# Patient Record
Sex: Male | Born: 1965 | Race: Black or African American | Hispanic: No | Marital: Single | Smoking: Never smoker
Health system: Southern US, Community
[De-identification: ages and names within clinical notes are randomized; demographics above are authoritative.]

## PROBLEM LIST (undated history)

## (undated) DIAGNOSIS — T7840XA Allergy, unspecified, initial encounter: Secondary | ICD-10-CM

## (undated) DIAGNOSIS — D649 Anemia, unspecified: Secondary | ICD-10-CM

## (undated) DIAGNOSIS — J45909 Unspecified asthma, uncomplicated: Secondary | ICD-10-CM

## (undated) HISTORY — DX: Anemia, unspecified: D64.9

## (undated) HISTORY — DX: Allergy, unspecified, initial encounter: T78.40XA

---

## 2018-01-21 ENCOUNTER — Ambulatory Visit (HOSPITAL_COMMUNITY)
Admission: EM | Admit: 2018-01-21 | Discharge: 2018-01-21 | Disposition: A | Discharge status: Home / Self Care | Payer: Commercial Managed Care - PPO | Attending: Family Medicine | Admitting: Family Medicine

## 2018-01-21 ENCOUNTER — Encounter (HOSPITAL_COMMUNITY): Payer: Self-pay | Admitting: Emergency Medicine

## 2018-01-21 DIAGNOSIS — J4520 Mild intermittent asthma, uncomplicated: Secondary | ICD-10-CM

## 2018-01-21 HISTORY — DX: Unspecified asthma, uncomplicated: J45.909

## 2018-01-21 MED ORDER — MONTELUKAST SODIUM 10 MG PO TABS: 10.0000 mg | ORAL_TABLET | Freq: Every day | ORAL | 1 refills | Status: DC

## 2018-01-21 MED ORDER — ALBUTEROL SULFATE HFA 108 (90 BASE) MCG/ACT IN AERS: 1.0000 | INHALATION_SPRAY | Freq: Four times a day (QID) | RESPIRATORY_TRACT | 1 refills | Status: DC | PRN

## 2018-01-21 NOTE — ED Triage Notes (Signed)
PT requests refill for albuterol inhaler and a recommendation for a PCP.

## 2018-01-21 NOTE — ED Provider Notes (Signed)
Eddie Glass    CSN: [id number] Arrival date & time: 01/21/18  1837     History   Chief Complaint Chief Complaint  Patient presents with  . Medication Refill    HPI Eddie Glass is a 52 y.o. male.   HPI  Patient has intermittent asthma.  He usually uses albuterol infrequently.  For the last couple months he is needed it more frequently.  He has been using it almost daily.  He would like the name of a primary care doctor close to his home.  He also would like a refill of his albuterol.  He is in good health otherwise and on no medication.  He use the albuterol today and currently has no wheezing.  He has had no history of cigarette smoking, COPD, or other lung disease.  He needs updated immunizations and pneumonia shots.   Past Medical History:  Diagnosis Date  . Asthma     There are no active problems to display for this patient.   History reviewed. No pertinent surgical history.     Home Medications    Prior to Admission medications   Medication Sig Start Date End Date Taking? Authorizing Provider  albuterol (PROVENTIL HFA;VENTOLIN HFA) 108 (90 Base) MCG/ACT inhaler Inhale into the lungs every 6 (six) hours as needed for wheezing or shortness of breath.   Yes [provider]  albuterol (PROAIR HFA) 108 (90 Base) MCG/ACT inhaler Inhale 1 puff into the lungs every 6 (six) hours as needed for wheezing or shortness of breath. 01/21/18   Raylene Everts, MD  montelukast (SINGULAIR) 10 MG tablet Take 1 tablet (10 mg total) by mouth at bedtime. 01/21/18   Raylene Everts, MD    Family History No family history on file. No family history of asthma or allergies Social History Social History   Tobacco Use  . Smoking status: Not on file  Substance Use Topics  . Alcohol use: Not on file  . Drug use: Not on file     Allergies   Patient has no known allergies.   Review of Systems Review of Systems  Constitutional: Negative for chills and fever.   HENT: Negative for ear pain and sore throat.   Eyes: Negative for pain and visual disturbance.  Respiratory: Positive for wheezing. Negative for cough and shortness of breath.   Cardiovascular: Negative for chest pain and palpitations.  Gastrointestinal: Negative for abdominal pain and vomiting.  Genitourinary: Negative for dysuria and hematuria.  Musculoskeletal: Negative for arthralgias and back pain.  Skin: Negative for color change and rash.  Neurological: Negative for seizures and syncope.  All other systems reviewed and are negative.    Physical Exam Triage Vital Signs ED Triage Vitals  Enc Vitals Group     BP 01/21/18 1904 139/90     Pulse Rate 01/21/18 1904 80     Resp 01/21/18 1904 16     Temp 01/21/18 1904 98.2 F (36.8 C)     Temp Source 01/21/18 1904 Oral     SpO2 01/21/18 1904 99 %     Weight 01/21/18 1906 260 lb (117.9 kg)     Height --      Head Circumference --      Peak Flow --      Pain Score 01/21/18 1906 0     Pain Loc --      Pain Edu? --      Excl. in [place]? --    No data found.  Updated Vital Signs BP 139/90 (BP Location: Right Arm)   Pulse 80   Temp 98.2 F (36.8 C) (Oral)   Resp 16   Wt 260 lb (117.9 kg)   SpO2 99%      Physical Exam  Constitutional: He appears well-developed and well-nourished. No distress.  HENT:  Head: Normocephalic and atraumatic.  Mouth/Throat: Oropharynx is clear and moist.  Eyes: Pupils are equal, round, and reactive to light. Conjunctivae are normal.  Neck: Normal range of motion.  Cardiovascular: Normal rate, regular rhythm and normal heart sounds.  Pulmonary/Chest: Effort normal and breath sounds normal. No respiratory distress. He has no wheezes.  Abdominal: Soft. He exhibits no distension.  Musculoskeletal: Normal range of motion. He exhibits no edema.  Neurological: He is alert.  Skin: Skin is warm and dry.  Psychiatric: He has a normal mood and affect. His behavior is normal.     UC Treatments /  Results  Labs (all labs ordered are listed, but only abnormal results are displayed) Labs Reviewed - No data to display  EKG None  Radiology No results found.  Procedures Procedures (including critical care time)  Medications Ordered in UC Medications - No data to display  Initial Impression / Assessment and Plan / UC Course  I have reviewed the triage vital signs and the nursing notes.  Pertinent labs & imaging results that were available during my care of the patient were reviewed by me and considered in my medical decision making (see chart for details).    Discussed with the patient that I am going to add Singulair to see if this reduces his need for the albuterol.  I gave him the name of a family practice close to his home that is accepting new patients.  Final Clinical Impressions(s) / UC Diagnoses   Final diagnoses:  Mild intermittent asthma without complication     Discharge Instructions     Take the singulair once a day See if this helps you to reduce the albuterol inhaler Call [facility]   ED Prescriptions    Medication Sig Dispense Auth. Provider   montelukast (SINGULAIR) 10 MG tablet Take 1 tablet (10 mg total) by mouth at bedtime. 30 tablet Raylene Everts, MD   albuterol [facility] HFA) 108 (90 Base) MCG/ACT inhaler Inhale 1 puff into the lungs every 6 (six) hours as needed for wheezing or shortness of breath. 1 Inhaler Raylene Everts, MD     Controlled Substance Prescriptions [place] Controlled Substance Registry consulted? Not Applicable   Raylene Everts, MD 01/21/18 1929

## 2018-01-21 NOTE — Discharge Instructions (Signed)
Take the singulair once a day See if this helps you to reduce the albuterol inhaler Call [facility]

## 2018-01-30 ENCOUNTER — Encounter: Payer: Self-pay | Admitting: *Deleted

## 2018-02-11 ENCOUNTER — Other Ambulatory Visit: Payer: Self-pay

## 2018-02-11 ENCOUNTER — Encounter: Payer: Self-pay | Admitting: Family Medicine

## 2018-02-11 ENCOUNTER — Ambulatory Visit (INDEPENDENT_AMBULATORY_CARE_PROVIDER_SITE_OTHER): Payer: Commercial Managed Care - PPO | Admitting: Family Medicine

## 2018-02-11 VITALS — BP 120/70 | HR 91 | Temp 98.0°F | Resp 16 | Ht 73.0 in | Wt 272.5 lb

## 2018-02-11 DIAGNOSIS — Z0000 Encounter for general adult medical examination without abnormal findings: Secondary | ICD-10-CM | POA: Diagnosis not present

## 2018-02-11 DIAGNOSIS — J45909 Unspecified asthma, uncomplicated: Secondary | ICD-10-CM | POA: Insufficient documentation

## 2018-02-11 DIAGNOSIS — E669 Obesity, unspecified: Secondary | ICD-10-CM | POA: Diagnosis not present

## 2018-02-11 DIAGNOSIS — Z1211 Encounter for screening for malignant neoplasm of colon: Secondary | ICD-10-CM

## 2018-02-11 DIAGNOSIS — J4520 Mild intermittent asthma, uncomplicated: Secondary | ICD-10-CM

## 2018-02-11 DIAGNOSIS — T7840XA Allergy, unspecified, initial encounter: Secondary | ICD-10-CM | POA: Insufficient documentation

## 2018-02-11 DIAGNOSIS — Z6835 Body mass index (BMI) 35.0-35.9, adult: Secondary | ICD-10-CM

## 2018-02-11 MED ORDER — ZOSTER VAC RECOMB ADJUVANTED 50 MCG/0.5ML IM SUSR: 0.5000 mL | Freq: Once | INTRAMUSCULAR | 0 refills | Status: AC

## 2018-02-11 MED ORDER — BECLOMETHASONE DIPROP HFA 40 MCG/ACT IN AERB: 1.0000 | INHALATION_SPRAY | Freq: Two times a day (BID) | RESPIRATORY_TRACT | 1 refills | Status: DC

## 2018-02-11 NOTE — Patient Instructions (Addendum)
Come back any morning after 8 am for fasting labs.  We will call you with results of labs in 2-3 days.  If there is anything to discuss we will address it at the call back or schedule a follow up appointment.    Health Maintenance, Male A healthy lifestyle and preventive care is important for your health and wellness. Ask your health care provider about what schedule of regular examinations is right for you. What should I know about weight and diet? Eat a Healthy Diet  Eat plenty of vegetables, fruits, whole grains, low-fat dairy products, and lean protein.  Do not eat a lot of foods high in solid fats, added sugars, or salt.  Maintain a Healthy Weight Regular exercise can help you achieve or maintain a healthy weight. You should:  Do at least 150 minutes of exercise each week. The exercise should increase your heart rate and make you sweat (moderate-intensity exercise).  Do strength-training exercises at least twice a week.  Watch Your Levels of Cholesterol and Blood Lipids  Have your blood tested for lipids and cholesterol every 5 years starting at 52 years of age. If you are at high risk for heart disease, you should start having your blood tested when you are 52 years old. You may need to have your cholesterol levels checked more often if: ? Your lipid or cholesterol levels are high. ? You are older than 52 years of age. ? You are at high risk for heart disease.  What should I know about cancer screening? Many types of cancers can be detected early and may often be prevented. Lung Cancer  You should be screened every year for lung cancer if: ? You are a current smoker who has smoked for at least 30 years. ? You are a former smoker who has quit within the past 15 years.  Talk to your health care provider about your screening options, when you should start screening, and how often you should be screened.  Colorectal Cancer  Routine colorectal cancer screening usually  begins at 52 years of age and should be repeated every 5-10 years until you are 52 years old. You may need to be screened more often if early forms of precancerous polyps or small growths are found. Your health care provider may recommend screening at an earlier age if you have risk factors for colon cancer.  Your health care provider may recommend using home test kits to check for hidden blood in the stool.  A small camera at the end of a tube can be used to examine your colon (sigmoidoscopy or colonoscopy). This checks for the earliest forms of colorectal cancer.  Prostate and Testicular Cancer  Depending on your age and overall health, your health care provider may do certain tests to screen for prostate and testicular cancer.  Talk to your health care provider about any symptoms or concerns you have about testicular or prostate cancer.  Skin Cancer  Check your skin from head to toe regularly.  Tell your health care provider about any new moles or changes in moles, especially if: ? There is a change in a mole's size, shape, or color. ? You have a mole that is larger than a pencil eraser.  Always use sunscreen. Apply sunscreen liberally and repeat throughout the day.  Protect yourself by wearing long sleeves, pants, a wide-brimmed hat, and sunglasses when outside.  What should I know about heart disease, diabetes, and high blood pressure?  If you are 18-39  years of age, have your blood pressure checked every 3-5 years. If you are 44 years of age or older, have your blood pressure checked every year. You should have your blood pressure measured twice-once when you are at a hospital or clinic, and once when you are not at a hospital or clinic. Record the average of the two measurements. To check your blood pressure when you are not at a hospital or clinic, you can use: ? An automated blood pressure machine at a pharmacy. ? A home blood pressure monitor.  Talk to your health care  provider about your target blood pressure.  If you are between 88-35 years old, ask your health care provider if you should take aspirin to prevent heart disease.  Have regular diabetes screenings by checking your fasting blood sugar level. ? If you are at a normal weight and have a low risk for diabetes, have this test once every three years after the age of 27. ? If you are overweight and have a high risk for diabetes, consider being tested at a younger age or more often.  A one-time screening for abdominal aortic aneurysm (AAA) by ultrasound is recommended for men aged 3-75 years who are current or former smokers. What should I know about preventing infection? Hepatitis B If you have a higher risk for hepatitis B, you should be screened for this virus. Talk with your health care provider to find out if you are at risk for hepatitis B infection. Hepatitis C Blood testing is recommended for:  Everyone born from [birth year] through 1965.  Anyone with known risk factors for hepatitis C.  Sexually Transmitted Diseases (STDs)  You should be screened each year for STDs including gonorrhea and chlamydia if: ? You are sexually active and are younger than 52 years of age. ? You are older than 52 years of age and your health care provider tells you that you are at risk for this type of infection. ? Your sexual activity has changed since you were last screened and you are at an increased risk for chlamydia or gonorrhea. Ask your health care provider if you are at risk.  Talk with your health care provider about whether you are at high risk of being infected with HIV. Your health care provider may recommend a prescription medicine to help prevent HIV infection.  What else can I do?  Schedule regular health, dental, and eye exams.  Stay current with your vaccines (immunizations).  Do not use any tobacco products, such as cigarettes, chewing tobacco, and e-cigarettes. If you need help quitting, ask  your health care provider.  Limit alcohol intake to no more than 2 drinks per day. One drink equals 12 ounces of beer, 5 ounces of wine, or 1 ounces of hard liquor.  Do not use street drugs.  Do not share needles.  Ask your health care provider for help if you need support or information about quitting drugs.  Tell your health care provider if you often feel depressed.  Tell your health care provider if you have ever been abused or do not feel safe at home. This information is not intended to replace advice given to you by your health care provider. Make sure you discuss any questions you have with your health care provider. Document Released: 12/29/2007 Document Revised: 02/29/2016 Document Reviewed: 04/05/2015 Elsevier Interactive Patient Education  2018 [employer].   Asthma, Adult Asthma is a recurring condition in which the airways tighten and narrow. Asthma can make  it difficult to breathe. It can cause coughing, wheezing, and shortness of breath. Asthma episodes, also called asthma attacks, range from minor to life-threatening. Asthma cannot be cured, but medicines and lifestyle changes can help control it. What are the causes? Asthma is believed to be caused by inherited (genetic) and environmental factors, but its exact cause is unknown. Asthma may be triggered by allergens, lung infections, or irritants in the air. Asthma triggers are different for each person. Common triggers include:  Animal dander.  Dust mites.  Cockroaches.  Pollen from trees or grass.  Mold.  Smoke.  Air pollutants such as dust, household cleaners, hair sprays, aerosol sprays, paint fumes, strong chemicals, or strong odors.  Cold air, weather changes, and winds (which increase molds and pollens in the air).  Strong emotional expressions such as crying or laughing hard.  Stress.  Certain medicines (such as aspirin) or types of drugs (such as beta-blockers).  Sulfites in foods and drinks.  Foods and drinks that may contain sulfites include dried fruit, potato chips, and sparkling grape juice.  Infections or inflammatory conditions such as the flu, a cold, or an inflammation of the nasal membranes (rhinitis).  Gastroesophageal reflux disease (GERD).  Exercise or strenuous activity.  What are the signs or symptoms? Symptoms may occur immediately after asthma is triggered or many hours later. Symptoms include:  Wheezing.  Excessive nighttime or early morning coughing.  Frequent or severe coughing with a common cold.  Chest tightness.  Shortness of breath.  How is this diagnosed? The diagnosis of asthma is made by a review of your medical history and a physical exam. Tests may also be performed. These may include:  Lung function studies. These tests show how much air you breathe in and out.  Allergy tests.  Imaging tests such as X-rays.  How is this treated? Asthma cannot be cured, but it can usually be controlled. Treatment involves identifying and avoiding your asthma triggers. It also involves medicines. There are 2 classes of medicine used for asthma treatment:  Controller medicines. These prevent asthma symptoms from occurring. They are usually taken every day.  Reliever or rescue medicines. These quickly relieve asthma symptoms. They are used as needed and provide short-term relief.  Your health care provider will help you create an asthma action plan. An asthma action plan is a written plan for managing and treating your asthma attacks. It includes a list of your asthma triggers and how they may be avoided. It also includes information on when medicines should be taken and when their dosage should be changed. An action plan may also involve the use of a device called a peak flow meter. A peak flow meter measures how well the lungs are working. It helps you monitor your condition. Follow these instructions at home:  Take medicines only as directed by your  health care provider. Speak with your health care provider if you have questions about how or when to take the medicines.  Use a peak flow meter as directed by your health care provider. Record and keep track of readings.  Understand and use the action plan to help minimize or stop an asthma attack without needing to seek medical care.  Control your home environment in the following ways to help prevent asthma attacks: ? Do not smoke. Avoid being exposed to secondhand smoke. ? Change your heating and air conditioning filter regularly. ? Limit your use of fireplaces and wood stoves. ? Get rid of pests (such as roaches and  mice) and their droppings. ? Throw away plants if you see mold on them. ? Clean your floors and dust regularly. Use unscented cleaning products. ? Try to have someone else vacuum for you regularly. Stay out of rooms while they are being vacuumed and for a short while afterward. If you vacuum, use a dust mask from a hardware store, a double-layered or microfilter vacuum cleaner bag, or a vacuum cleaner with a HEPA filter. ? Replace carpet with wood, tile, or vinyl flooring. Carpet can trap dander and dust. ? Use allergy-proof pillows, mattress covers, and box spring covers. ? Wash bed sheets and blankets every week in hot water and dry them in a dryer. ? Use blankets that are made of polyester or cotton. ? Clean bathrooms and kitchens with bleach. If possible, have someone repaint the walls in these rooms with mold-resistant paint. Keep out of the rooms that are being cleaned and painted. ? Wash hands frequently. Contact a health care provider if:  You have wheezing, shortness of breath, or a cough even if taking medicine to prevent attacks.  The colored mucus you cough up (sputum) is thicker than usual.  Your sputum changes from clear or white to yellow, green, gray, or bloody.  You have any problems that may be related to the medicines you are taking (such as a rash,  itching, swelling, or trouble breathing).  You are using a reliever medicine more than 2-3 times per week.  Your peak flow is still at 50-79% of your personal best after following your action plan for 1 hour.  You have a fever. Get help right away if:  You seem to be getting worse and are unresponsive to treatment during an asthma attack.  You are short of breath even at rest.  You get short of breath when doing very little physical activity.  You have difficulty eating, drinking, or talking due to asthma symptoms.  You develop chest pain.  You develop a fast heartbeat.  You have a bluish color to your lips or fingernails.  You are light-headed, dizzy, or faint.  Your peak flow is less than 50% of your personal best. This information is not intended to replace advice given to you by your health care provider. Make sure you discuss any questions you have with your health care provider. Document Released: 07/02/2005 Document Revised: 12/14/2015 Document Reviewed: 01/29/2013 Elsevier Interactive Patient Education  2017 [employer].

## 2018-02-11 NOTE — Progress Notes (Signed)
Patient: Eddie Glass, Male    DOB: 10-01-1965, 52 y.o.   MRN: [id number] Visit Date: 02/14/2018  Today's Provider: Delsa Grana, PA-C    Chief Complaint  Patient presents with  . Establish Care   Subjective:    Annual physical exam Eddie Glass is a 51 y.o. male who presents today for health maintenance and complete physical. He feels well. He reports exercising not at all. He reports he is sleeping well.  Works at a [employer] - does physical labor all day for work Has cut out sugary drinks after weight was recently increasing.  Hx of mild intermittent asthma that has been much more severe for the past two months secondary to allergens in the house he is renting.  He usually rarely need SABA, but he is currenlty using it almost daily which in his house.  He also has been blowing a fan and opening up windows to get fresh air.  Sometimes he tries to not even be home so he does not have frequent asthma attacks.  Simple asthma hx-    02/11/18 1549  Asthma History - Simple  Symptoms Daily  Nighttime Awakenings >1/wk but not nightly  Asthma Severity    He is new to establish care at this office, has not been to the doctors in some time and has used urgent care here and there when he needs something.  His work is strenuous but otherwise he does not exercise much.  He has gained weight over the past couple years and states he just needs "to decide to loose weight and [he] can do it."     Review of Systems  Constitutional: Negative.  Negative for activity change, appetite change, fatigue and unexpected weight change.  HENT: Negative.   Eyes: Negative.   Respiratory: Negative.   Cardiovascular: Negative.  Negative for chest pain, palpitations and leg swelling.  Gastrointestinal: Negative.  Negative for abdominal pain and blood in stool.  Endocrine: Negative.   Genitourinary: Negative.  Negative for decreased urine volume, difficulty urinating, testicular pain and urgency.    Skin: Negative.  Negative for color change and pallor.  Allergic/Immunologic: Negative.   Neurological: Negative.  Negative for syncope, weakness, light-headedness and numbness.  Psychiatric/Behavioral: Negative.  Negative for confusion, dysphoric mood, self-injury and suicidal ideas. The patient is not nervous/anxious.   All other systems reviewed and are negative.   Social History      He  reports that he has never smoked. He has never used smokeless tobacco. He reports that he drinks about 3.0 oz of alcohol per week. He reports that he does not use drugs.       Social History   Socioeconomic History  . Marital status: Single    Spouse name: Not on file  . Number of children: 1  . Years of education: Not on file  . Highest education level: Not on file  Occupational History  . Not on file  Social Needs  . Financial resource strain: Not on file  . Food insecurity:    Worry: Not on file    Inability: Not on file  . Transportation needs:    Medical: Not on file    Non-medical: Not on file  Tobacco Use  . Smoking status: Never Smoker  . Smokeless tobacco: Never Used  Substance and Sexual Activity  . Alcohol use: Yes    Alcohol/week: 3.0 oz    Types: 5 Standard drinks or equivalent per week  Comment: 0-3 times a week, social, 2-3 drinks of everything, burbon club  . Drug use: Never  . Sexual activity: Not Currently  Lifestyle  . Physical activity:    Days per week: 0 days    Minutes per session: Not on file  . Stress: Not on file  Relationships  . Social connections:    Talks on phone: Three times a week    Gets together: Not on file    Attends religious service: Not on file    Active member of club or organization: Not on file    Attends meetings of clubs or organizations: Not on file    Relationship status: Not on file  Other Topics Concern  . Not on file  Social History Narrative  . Not on file    Past Medical History:  Diagnosis Date  . Allergy     seasonal  . Asthma      Patient Active Problem List   Diagnosis Date Noted  . Asthma 02/11/2018  . Allergy 02/11/2018    History reviewed. No pertinent surgical history.  Family History        Family Status  Relation Name Status  . Mother  Alive  . Father  Alive        His family history includes Cancer in his father; Diabetes in his mother.      Allergies  Allergen Reactions  . Other Anaphylaxis    Cats     Current Outpatient Medications:  .  albuterol (PROAIR HFA) 108 (90 Base) MCG/ACT inhaler, Inhale 1 puff into the lungs every 6 (six) hours as needed for wheezing or shortness of breath., Disp: 1 Inhaler, Rfl: 1 .  aspirin 81 MG chewable tablet, Chew by mouth daily., Disp: , Rfl:  .  montelukast (SINGULAIR) 10 MG tablet, Take 1 tablet (10 mg total) by mouth at bedtime., Disp: 30 tablet, Rfl: 1   Patient Care Team: Delsa Grana, PA-C as PCP - General (Family Medicine)      Objective:   Vitals: BP 120/70   Pulse 91   Temp 98 F (36.7 C)   Resp 16   Ht 6\' 1"  (1.854 m)   Wt 272 lb 8 oz (123.6 kg)   SpO2 98%   BMI 35.95 kg/m    Vitals:   02/11/18 1527  BP: 120/70  Pulse: 91  Resp: 16  Temp: 98 F (36.7 C)  SpO2: 98%  Weight: 272 lb 8 oz (123.6 kg)  Height: 6\' 1"  (1.854 m)     Physical Exam  Constitutional: He is oriented to person, place, and time. He appears well-developed and well-nourished.  Non-toxic appearance. He does not appear ill. No distress.  HENT:  Head: Normocephalic and atraumatic.  Right Ear: Tympanic membrane, external ear and ear canal normal.  Left Ear: Tympanic membrane, external ear and ear canal normal.  Nose: Nose normal. No mucosal edema or rhinorrhea. Right sinus exhibits no maxillary sinus tenderness and no frontal sinus tenderness. Left sinus exhibits no maxillary sinus tenderness and no frontal sinus tenderness.  Mouth/Throat: Uvula is midline and oropharynx is clear and moist. No trismus in the jaw. No uvula swelling. No  oropharyngeal exudate, posterior oropharyngeal edema or posterior oropharyngeal erythema.  Eyes: Pupils are equal, round, and reactive to light. Conjunctivae, EOM and lids are normal.  Neck: Trachea normal, normal range of motion and phonation normal. Neck supple. No tracheal deviation present.  Cardiovascular: Regular rhythm, normal heart sounds and normal pulses. Exam reveals no  gallop and no friction rub.  No murmur heard. Pulses:      Radial pulses are 2+ on the right side, and 2+ on the left side.       Posterior tibial pulses are 2+ on the right side, and 2+ on the left side.  Pulmonary/Chest: Effort normal and breath sounds normal. He has no wheezes. He has no rhonchi. He has no rales.  Abdominal: Soft. Normal appearance and bowel sounds are normal. He exhibits no distension. There is no tenderness. There is no rebound and no guarding.  Musculoskeletal: Normal range of motion. He exhibits no edema.  Neurological: He is alert and oriented to person, place, and time. Gait normal.  Skin: Skin is warm, dry and intact. Capillary refill takes less than 2 seconds. No rash noted. He is not diaphoretic.  Psychiatric: He has a normal mood and affect. His speech is normal and behavior is normal.  Nursing note and vitals reviewed.    Depression Screen PHQ 2/9 Scores 02/11/2018  PHQ - 2 Score 0     Office Visit from 02/11/2018 in [facility]  AUDIT-C Score  4        Assessment & Plan:     Routine Health Maintenance and Physical Exam  Exercise Activities and Dietary recommendations Goals    . Weight (lb) < 200 lb (90.7 kg)     Increase exercise to 3-5 days with 30 min aerobic exercise Decrease calories, avoid high fat meat, fried foods, increase fruits and vegetables       Immunization History  Administered Date(s) Administered  . Tdap 02/12/2018  Shingrix sent to pharmacy Pt needs PCV with asthma   Health Maintenance  Topic Date Due  . HIV Screening   01/19/1981  . COLONOSCOPY  01/20/2016  . INFLUENZA VACCINE  02/13/2018  . TETANUS/TDAP  02/13/2028   Tdap updated 02/12/18 HIV ordered with routine blood work Referral to GI for colonoscopy  Instructed to get flu when it is in stock in the next couple months  Discussed health benefits of physical activity, and encouraged him to engage in regular exercise appropriate for his age and condition.    --------------------------------------------------------------------     ICD-10-CM   1. General medical exam Z00.00 HIV antibody    CBC with Differential/Platelet    COMPLETE METABOLIC PANEL WITH GFR    Lipid panel  2. Mild intermittent asthma without complication A21.30   3. Screening for colon cancer Z12.11 Ambulatory referral to Gastroenterology  4. Class 2 obesity with body mass index (BMI) of 35.0 to 35.9 in adult, unspecified obesity type, unspecified whether serious comorbidity present E66.9    Z68.35    Given sample of Symbicort to help cover for allergy triggers while living in rental home, trying to move out in the next 2 months.  When he is not at home, he has his baseline mild intermittent asthma.  Currently undercontrolled with SABA and montelukast.    Delsa Grana, PA-C 02/14/18 12:31 AM/  [facility]

## 2018-02-12 ENCOUNTER — Ambulatory Visit (INDEPENDENT_AMBULATORY_CARE_PROVIDER_SITE_OTHER): Payer: Commercial Managed Care - PPO

## 2018-02-12 ENCOUNTER — Other Ambulatory Visit: Payer: Commercial Managed Care - PPO

## 2018-02-12 ENCOUNTER — Encounter: Payer: Self-pay | Admitting: Gastroenterology

## 2018-02-12 DIAGNOSIS — R7301 Impaired fasting glucose: Secondary | ICD-10-CM | POA: Diagnosis not present

## 2018-02-12 DIAGNOSIS — Z23 Encounter for immunization: Secondary | ICD-10-CM

## 2018-02-12 DIAGNOSIS — Z0000 Encounter for general adult medical examination without abnormal findings: Secondary | ICD-10-CM

## 2018-02-13 NOTE — Progress Notes (Signed)
Can you add on A1C for elevated fasting blood sugar, thank you

## 2018-02-14 NOTE — Progress Notes (Signed)
Labs show mild anemia, elevated fasting glucose, normal kidney function, normal liver function, HIV screening negative, elevated LDL with normal total cholesterol.  A1C pending, will contact pt when all labs are resulted.

## 2018-02-14 NOTE — Progress Notes (Signed)
Patient got TDAP in left deltoid. Patient tolerated well.

## 2018-02-15 LAB — LIPID PANEL
Cholesterol: 188 mg/dL (ref <200)
HDL: 49 mg/dL (ref >40)
LDL Cholesterol (Calc): 113 mg/dL (calc) — ABNORMAL HIGH
NON-HDL CHOLESTEROL (CALC): 139 mg/dL — AB (ref <130)
Total CHOL/HDL Ratio: 3.8 (calc) (ref <5.0)
Triglycerides: 145 mg/dL (ref <150)

## 2018-02-15 LAB — CBC WITH DIFFERENTIAL/PLATELET
Basophils Absolute: 46 cells/uL (ref 0–200)
Basophils Relative: 0.5 %
EOS ABS: 359 {cells}/uL (ref 15–500)
Eosinophils Relative: 3.9 %
HCT: 38.1 % — ABNORMAL LOW (ref 38.5–50.0)
Hemoglobin: 12.7 g/dL — ABNORMAL LOW (ref 13.2–17.1)
LYMPHS ABS: 2466 {cells}/uL (ref 850–3900)
MCH: 25.7 pg — AB (ref 27.0–33.0)
MCHC: 33.3 g/dL (ref 32.0–36.0)
MCV: 77 fL — AB (ref 80.0–100.0)
MPV: 9.2 fL (ref 7.5–12.5)
Monocytes Relative: 5.2 %
NEUTROS PCT: 63.6 %
Neutro Abs: 5851 cells/uL (ref 1500–7800)
Platelets: 364 10*3/uL (ref 140–400)
RBC: 4.95 10*6/uL (ref 4.20–5.80)
RDW: 15.1 % — AB (ref 11.0–15.0)
Total Lymphocyte: 26.8 %
WBC: 9.2 10*3/uL (ref 3.8–10.8)
WBCMIX: 478 {cells}/uL (ref 200–950)

## 2018-02-15 LAB — HEMOGLOBIN A1C
Hgb A1c MFr Bld: 6.4 % of total Hgb — ABNORMAL HIGH (ref <5.7)
Mean Plasma Glucose: 137 (calc)
eAG (mmol/L): 7.6 (calc)

## 2018-02-15 LAB — COMPLETE METABOLIC PANEL WITH GFR
AG RATIO: 1.4 (calc) (ref 1.0–2.5)
ALT: 14 U/L (ref 9–46)
AST: 20 U/L (ref 10–35)
Albumin: 4.2 g/dL (ref 3.6–5.1)
Alkaline phosphatase (APISO): 71 U/L (ref 40–115)
BILIRUBIN TOTAL: 0.4 mg/dL (ref 0.2–1.2)
BUN: 14 mg/dL (ref 7–25)
CALCIUM: 9.5 mg/dL (ref 8.6–10.3)
CO2: 25 mmol/L (ref 20–32)
Chloride: 102 mmol/L (ref 98–110)
Creat: 1.27 mg/dL (ref 0.70–1.33)
GFR, EST NON AFRICAN AMERICAN: 65 mL/min/{1.73_m2} (ref >=60)
GFR, Est African American: 75 mL/min/{1.73_m2} (ref >=60)
GLOBULIN: 3 g/dL (ref 1.9–3.7)
Glucose, Bld: 136 mg/dL — ABNORMAL HIGH (ref 65–99)
POTASSIUM: 4.8 mmol/L (ref 3.5–5.3)
SODIUM: 137 mmol/L (ref 135–146)
Total Protein: 7.2 g/dL (ref 6.1–8.1)

## 2018-02-15 LAB — TEST AUTHORIZATION

## 2018-02-15 LAB — HIV ANTIBODY (ROUTINE TESTING W REFLEX): HIV 1&2 Ab, 4th Generation: NONREACTIVE

## 2018-03-03 ENCOUNTER — Other Ambulatory Visit: Payer: Self-pay | Admitting: Family Medicine

## 2018-03-03 ENCOUNTER — Ambulatory Visit (AMBULATORY_SURGERY_CENTER): Payer: Self-pay

## 2018-03-03 VITALS — Ht 73.0 in | Wt 267.4 lb

## 2018-03-03 DIAGNOSIS — Z1211 Encounter for screening for malignant neoplasm of colon: Secondary | ICD-10-CM

## 2018-03-03 MED ORDER — NA SULFATE-K SULFATE-MG SULF 17.5-3.13-1.6 GM/177ML PO SOLN: 1.0000 | Freq: Once | ORAL | 0 refills | Status: AC

## 2018-03-03 NOTE — Progress Notes (Signed)
Denies allergies to eggs or soy products. Denies complication of anesthesia or sedation. Denies use of weight loss medication. Denies use of O2.   Emmi instructions declined.  

## 2018-03-05 ENCOUNTER — Encounter: Payer: Self-pay | Admitting: Gastroenterology

## 2018-03-16 ENCOUNTER — Other Ambulatory Visit: Payer: Self-pay | Admitting: Family Medicine

## 2018-03-19 ENCOUNTER — Ambulatory Visit (AMBULATORY_SURGERY_CENTER): Payer: Commercial Managed Care - PPO | Admitting: Gastroenterology

## 2018-03-19 ENCOUNTER — Encounter: Payer: Self-pay | Admitting: Gastroenterology

## 2018-03-19 VITALS — BP 133/91 | HR 84 | Temp 97.8°F | Resp 17 | Ht 73.0 in | Wt 272.0 lb

## 2018-03-19 DIAGNOSIS — K6389 Other specified diseases of intestine: Secondary | ICD-10-CM | POA: Diagnosis not present

## 2018-03-19 DIAGNOSIS — D128 Benign neoplasm of rectum: Secondary | ICD-10-CM

## 2018-03-19 DIAGNOSIS — Z1211 Encounter for screening for malignant neoplasm of colon: Secondary | ICD-10-CM

## 2018-03-19 DIAGNOSIS — D129 Benign neoplasm of anus and anal canal: Secondary | ICD-10-CM

## 2018-03-19 DIAGNOSIS — D125 Benign neoplasm of sigmoid colon: Secondary | ICD-10-CM

## 2018-03-19 DIAGNOSIS — D126 Benign neoplasm of colon, unspecified: Secondary | ICD-10-CM | POA: Diagnosis not present

## 2018-03-19 MED ORDER — SODIUM CHLORIDE 0.9 % IV SOLN: 500.0000 mL | Freq: Once | INTRAVENOUS | Status: DC

## 2018-03-19 NOTE — Op Note (Signed)
[facility] Patient Name: Eddie Glass Procedure Date: 03/19/2018 10:47 AM MRN: [id number] Endoscopist: Gerrit Heck , MD Age: 52 Referring MD:  Date of Birth: [birth year] Gender: Male Account #: [id number] Procedure:                Colonoscopy Indications:              Screening for colorectal malignant neoplasm, This                            is the patient's first colonoscopy. No known family                            history of colon cancer and he is otherwise without                            active GI symptoms. Medicines:                Monitored Anesthesia Care Procedure:                Pre-Anesthesia Assessment:                           - Prior to the procedure, a History and Physical                            was performed, and patient medications and                            allergies were reviewed. The patient's tolerance of                            previous anesthesia was also reviewed. The risks                            and benefits of the procedure and the sedation                            options and risks were discussed with the patient.                            All questions were answered, and informed consent                            was obtained. Prior Anticoagulants: The patient has                            taken aspirin, last dose was 3 days prior to                            procedure. ASA Grade Assessment: II - A patient                            with mild systemic disease. After reviewing the  risks and benefits, the patient was deemed in                            satisfactory condition to undergo the procedure.                           After obtaining informed consent, the colonoscope                            was passed under direct vision. Throughout the                            procedure, the patient's blood pressure, pulse, and                            oxygen saturations were monitored  continuously. The                            Colonoscope was introduced through the anus and                            advanced to the the terminal ileum. The colonoscopy                            was performed without difficulty. The patient                            tolerated the procedure well. The quality of the                            bowel preparation was adequate. Scope In: 10:50:30 AM Scope Out: 11:04:00 AM Scope Withdrawal Time: 0 hours 11 minutes 52 seconds  Total Procedure Duration: 0 hours 13 minutes 30 seconds  Findings:                 The perianal and digital rectal examinations were                            normal.                           A 3 mm polyp was found in the sigmoid colon. The                            polyp was sessile. The polyp was removed with a                            cold snare. Resection and retrieval were complete.                            Estimated blood loss was minimal.                           A 3 mm polyp was found in the rectum. The polyp was  sessile. The polyp was removed with a cold snare.                            Resection and retrieval were complete. Estimated                            blood loss was minimal.                           Retroflexion in the right colon was performed.                           The exam was otherwise normal throughout the                            examined colon.                           The retroflexed view of the distal rectum and anal                            verge was normal and showed no anal or rectal                            abnormalities.                           The terminal ileum appeared normal. Complications:            No immediate complications. Estimated Blood Loss:     Estimated blood loss: none. Estimated blood loss                            was minimal. Impression:               - One 3 mm polyp in the sigmoid colon, removed with                             a cold snare. Resected and retrieved.                           - One 3 mm polyp in the rectum, removed with a cold                            snare. Resected and retrieved.                           - The distal rectum and anal verge are normal on                            retroflexion view.                           - The examined portion of the ileum was normal. Recommendation:           - Patient has a  contact number available for                            emergencies. The signs and symptoms of potential                            delayed complications were discussed with the                            patient. Return to normal activities tomorrow.                            Written discharge instructions were provided to the                            patient.                           - Resume previous diet today.                           - Continue present medications.                           - Resume aspirin at prior dose in 5 days.                           - Await pathology results.                           - Repeat colonoscopy in 5-10 years for surveillance                            based on pathology results.                           - Return to GI clinic PRN. Gerrit Heck, MD 03/19/2018 11:09:30 AM

## 2018-03-19 NOTE — Progress Notes (Signed)
Called to room to assist during endoscopic procedure.  Patient ID and intended procedure confirmed with present staff. Received instructions for my participation in the procedure from the performing physician.  

## 2018-03-19 NOTE — Patient Instructions (Signed)
YOU HAD AN ENDOSCOPIC PROCEDURE TODAY AT [facility]:   Refer to the procedure report that was given to you for any specific questions about what was found during the examination.  If the procedure report does not answer your questions, please call your gastroenterologist to clarify.  If you requested that your care partner not be given the details of your procedure findings, then the procedure report has been included in a sealed envelope for you to review at your convenience later.  YOU SHOULD EXPECT: Some feelings of bloating in the abdomen. Passage of more gas than usual.  Walking can help get rid of the air that was put into your GI tract during the procedure and reduce the bloating. If you had a lower endoscopy (such as a colonoscopy or flexible sigmoidoscopy) you may notice spotting of blood in your stool or on the toilet paper. If you underwent a bowel prep for your procedure, you may not have a normal bowel movement for a few days.  Please Note:  You might notice some irritation and congestion in your nose or some drainage.  This is from the oxygen used during your procedure.  There is no need for concern and it should clear up in a day or so.  SYMPTOMS TO REPORT IMMEDIATELY:   Following lower endoscopy (colonoscopy or flexible sigmoidoscopy):  Excessive amounts of blood in the stool  Significant tenderness or worsening of abdominal pains  Swelling of the abdomen that is new, acute  Fever of 100F or higher   For urgent or emergent issues, a gastroenterologist can be reached at any hour by calling [phone].   DIET:  We do recommend a small meal at first, but then you may proceed to your regular diet.  Drink plenty of fluids but you should avoid alcoholic beverages for 24 hours.  ACTIVITY:  You should plan to take it easy for the rest of today and you should NOT DRIVE or use heavy machinery until tomorrow (because of the sedation medicines used during the test).     FOLLOW UP: Our staff will call the number listed on your records the next business day following your procedure to check on you and address any questions or concerns that you may have regarding the information given to you following your procedure. If we do not reach you, we will leave a message.  However, if you are feeling well and you are not experiencing any problems, there is no need to return our call.  We will assume that you have returned to your regular daily activities without incident.  If any biopsies were taken you will be contacted by phone or by letter within the next 1-3 weeks.  Please call us at [phone] if you have not heard about the biopsies in 3 weeks.    SIGNATURES/CONFIDENTIALITY: You and/or your care partner have signed paperwork which will be entered into your electronic medical record.  These signatures attest to the fact that that the information above on your After Visit Summary has been reviewed and is understood.  Full responsibility of the confidentiality of this discharge information lies with you and/or your care-partner.  Polyp information given.  Resume Aspirin in 5 days.

## 2018-03-19 NOTE — Progress Notes (Signed)
Pt's states no medical or surgical changes since previsit or office visit. 

## 2018-03-19 NOTE — Progress Notes (Signed)
Report given to PACU, vss 

## 2018-03-20 ENCOUNTER — Telehealth: Payer: Self-pay

## 2018-03-20 NOTE — Telephone Encounter (Signed)
  Follow up Call-  Call back number 03/19/2018  Post procedure Call Back phone  # [phone]  Permission to leave phone message Yes  Some recent data might be hidden     Patient questions:  Do you have a fever, pain , or abdominal swelling? No. Pain Score  0 *  Have you tolerated food without any problems? Yes.    Have you been able to return to your normal activities? Yes.    Do you have any questions about your discharge instructions: Diet   No. Medications  No. Follow up visit  No.  Do you have questions or concerns about your Care? No.  Actions: * If pain score is 4 or above: No action needed, pain <4.

## 2018-03-21 ENCOUNTER — Encounter: Payer: Self-pay | Admitting: Gastroenterology

## 2018-06-24 ENCOUNTER — Ambulatory Visit: Payer: Self-pay | Admitting: Family Medicine

## 2018-07-01 ENCOUNTER — Encounter: Payer: Self-pay | Admitting: Family Medicine

## 2018-07-01 ENCOUNTER — Ambulatory Visit (INDEPENDENT_AMBULATORY_CARE_PROVIDER_SITE_OTHER): Payer: Commercial Managed Care - PPO | Admitting: Family Medicine

## 2018-07-01 VITALS — BP 138/90 | HR 67 | Temp 98.1°F | Resp 15 | Ht 73.0 in | Wt 261.2 lb

## 2018-07-01 DIAGNOSIS — E785 Hyperlipidemia, unspecified: Secondary | ICD-10-CM

## 2018-07-01 DIAGNOSIS — E669 Obesity, unspecified: Secondary | ICD-10-CM

## 2018-07-01 DIAGNOSIS — D649 Anemia, unspecified: Secondary | ICD-10-CM | POA: Diagnosis not present

## 2018-07-01 DIAGNOSIS — Z6835 Body mass index (BMI) 35.0-35.9, adult: Secondary | ICD-10-CM

## 2018-07-01 DIAGNOSIS — R7303 Prediabetes: Secondary | ICD-10-CM | POA: Diagnosis not present

## 2018-07-01 DIAGNOSIS — R5383 Other fatigue: Secondary | ICD-10-CM

## 2018-07-01 DIAGNOSIS — J4520 Mild intermittent asthma, uncomplicated: Secondary | ICD-10-CM

## 2018-07-01 DIAGNOSIS — T7840XD Allergy, unspecified, subsequent encounter: Secondary | ICD-10-CM

## 2018-07-01 DIAGNOSIS — E1165 Type 2 diabetes mellitus with hyperglycemia: Secondary | ICD-10-CM | POA: Insufficient documentation

## 2018-07-01 HISTORY — DX: Hyperlipidemia, unspecified: E78.5

## 2018-07-01 MED ORDER — ALBUTEROL SULFATE 108 (90 BASE) MCG/ACT IN AEPB: 2.0000 | INHALATION_SPRAY | RESPIRATORY_TRACT | 2 refills | Status: DC | PRN

## 2018-07-01 MED ORDER — MONTELUKAST SODIUM 10 MG PO TABS: 10.0000 mg | ORAL_TABLET | Freq: Every day | ORAL | 3 refills | Status: DC

## 2018-07-01 NOTE — Progress Notes (Signed)
Patient ID: Eddie Glass, male    DOB: 1965/09/09, 52 y.o.   MRN: [id number]  PCP: Delsa Grana, PA-C  Chief Complaint  Patient presents with  . Anemia  . Hyperglycemia    Subjective:   Eddie Glass is a 52 y.o. male, presents to clinic with CC of prediabetes, HLD, asthma and anemia follow up.  Since last visit he has had a colonoscopy done with 2 polyps removed which were benign.  He continues to live in the same home but states his asthma has improved.  He did take Singulair for 2 months but ran out of pills and he did not notice any worsening of his asthma, typically uses his inhaler 1-2 times a week and nighttime awakenings have decreased over the past several months.  Asthma history in health maintenance tab was completed Asthma well controlled - hasn't used inhaler in a week  Cut out sugar, loosing weight Supplementing with B12 and iron, feels like he is feeling generally healthier and SOB is better    Patient Active Problem List   Diagnosis Date Noted  . Asthma 02/11/2018  . Allergy 02/11/2018    Current Meds  Medication Sig  . albuterol (PROAIR HFA) 108 (90 Base) MCG/ACT inhaler Inhale 1 puff into the lungs every 6 (six) hours as needed for wheezing or shortness of breath.  . ferrous sulfate 325 (65 FE) MG EC tablet Take 325 mg by mouth 3 (three) times daily with meals.  . vitamin B-12 (CYANOCOBALAMIN) 500 MCG tablet Take 500 mcg by mouth daily.     Review of Systems  Constitutional: Negative.   HENT: Negative.   Eyes: Negative.   Respiratory: Negative.   Cardiovascular: Negative.   Gastrointestinal: Negative.   Endocrine: Negative.   Genitourinary: Negative.   Musculoskeletal: Negative.   Skin: Negative.   Allergic/Immunologic: Negative.   Neurological: Negative.   Hematological: Negative.   Psychiatric/Behavioral: Negative.   All other systems reviewed and are negative.      Objective:    Vitals:   07/01/18 1426  BP: 138/90  Pulse: 67  Resp: 15   Temp: 98.1 F (36.7 C)  TempSrc: Oral  SpO2: 98%  Weight: 261 lb 4 oz (118.5 kg)  Height: 6\' 1"  (1.854 m)      Physical Exam Constitutional:      General: He is not in acute distress.    Appearance: Normal appearance. He is well-developed. He is not ill-appearing, toxic-appearing or diaphoretic.  HENT:     Head: Normocephalic and atraumatic.     Jaw: No trismus.     Right Ear: Tympanic membrane, ear canal and external ear normal.     Left Ear: Tympanic membrane, ear canal and external ear normal.     Nose: Nose normal. No mucosal edema, congestion or rhinorrhea.     Right Sinus: No maxillary sinus tenderness or frontal sinus tenderness.     Left Sinus: No maxillary sinus tenderness or frontal sinus tenderness.     Mouth/Throat:     Mouth: Mucous membranes are moist.     Pharynx: Uvula midline. No oropharyngeal exudate, posterior oropharyngeal erythema or uvula swelling.  Eyes:     General: Lids are normal. No scleral icterus.    Conjunctiva/sclera: Conjunctivae normal.     Pupils: Pupils are equal, round, and reactive to light.  Neck:     Musculoskeletal: Normal range of motion and neck supple.     Trachea: Trachea and phonation normal. No tracheal deviation.  Cardiovascular:     Rate and Rhythm: Normal rate and regular rhythm.     Pulses: Normal pulses.          Radial pulses are 2+ on the right side and 2+ on the left side.       Posterior tibial pulses are 2+ on the right side and 2+ on the left side.     Heart sounds: Normal heart sounds. No murmur. No friction rub. No gallop.   Pulmonary:     Effort: Pulmonary effort is normal.     Breath sounds: Normal breath sounds. No wheezing, rhonchi or rales.  Abdominal:     General: Bowel sounds are normal. There is no distension.     Palpations: Abdomen is soft.     Tenderness: There is no abdominal tenderness. There is no guarding or rebound.  Musculoskeletal: Normal range of motion.  Skin:    General: Skin is warm and  dry.     Capillary Refill: Capillary refill takes less than 2 seconds.     Findings: No rash.  Neurological:     Mental Status: He is alert and oriented to person, place, and time.     Gait: Gait normal.  Psychiatric:        Mood and Affect: Mood normal.        Speech: Speech normal.        Behavior: Behavior normal.           Assessment & Plan:   Problem List Items Addressed This Visit      Respiratory   Asthma    Currently well controlled, using inhaler 1x per week.  No more night time wakenings.  Refill singulair for asthma and allergies, PRN albuterol inhaler      Relevant Medications   montelukast (SINGULAIR) 10 MG tablet   Albuterol Sulfate (PROAIR RESPICLICK) 416 (90 Base) MCG/ACT AEPB     Other   Allergy    rx refill singulair - did well with, ran out of meds and never asked pharmacy of clinic for med refil      Prediabetes    Last HbA1C 6.4, working on diet Recheck labs, continue diet and exercise Plan my change with lab results      Relevant Orders   Lipid panel   COMPLETE METABOLIC PANEL WITH GFR   Hemoglobin A1c   Hyperlipidemia    Last LDL >100 Working on diet and exercise, decreased ETOH intake Recheck fasting lipid      Relevant Orders   Lipid panel   CBC with Differential/Platelet   COMPLETE METABOLIC PANEL WITH GFR   Anemia    Mild unspecified anemia, he is tolerating iron supplement, she feels better, recheck CBC      Relevant Medications   vitamin B-12 (CYANOCOBALAMIN) 500 MCG tablet   Other Relevant Orders   CBC with Differential/Platelet    Other Visit Diagnoses    Class 2 obesity with body mass index (BMI) of 35.0 to 35.9 in adult, unspecified obesity type, unspecified whether serious comorbidity present    -  Primary   Relevant Orders   Lipid panel   Hemoglobin A1c   Fatigue, unspecified type       Relevant Orders   VITAMIN D 25 Hydroxy (Vit-D Deficiency, Fractures)          Delsa Grana, PA-C 07/01/18 2:36 PM

## 2018-07-01 NOTE — Patient Instructions (Addendum)
Return for fasting labs as soon as you can  Can do iron supplement 3x a week  Also can supplement with B12, Vit D, iron, folate and thiamine  - any multivitamin

## 2018-07-06 NOTE — Assessment & Plan Note (Signed)
Last HbA1C 6.4, working on diet Recheck labs, continue diet and exercise Plan my change with lab results

## 2018-07-06 NOTE — Assessment & Plan Note (Signed)
Mild unspecified anemia, he is tolerating iron supplement, she feels better, recheck CBC

## 2018-07-06 NOTE — Assessment & Plan Note (Signed)
rx refill singulair - did well with, ran out of meds and never asked pharmacy of clinic for med refil

## 2018-07-06 NOTE — Assessment & Plan Note (Signed)
Currently well controlled, using inhaler 1x per week.  No more night time wakenings.  Refill singulair for asthma and allergies, PRN albuterol inhaler

## 2018-07-06 NOTE — Assessment & Plan Note (Signed)
Last LDL >100 Working on diet and exercise, decreased ETOH intake Recheck fasting lipid

## 2018-07-10 ENCOUNTER — Other Ambulatory Visit: Payer: Commercial Managed Care - PPO

## 2018-07-10 DIAGNOSIS — R5383 Other fatigue: Secondary | ICD-10-CM

## 2018-07-10 DIAGNOSIS — R7303 Prediabetes: Secondary | ICD-10-CM

## 2018-07-10 DIAGNOSIS — D649 Anemia, unspecified: Secondary | ICD-10-CM

## 2018-07-10 DIAGNOSIS — E785 Hyperlipidemia, unspecified: Secondary | ICD-10-CM

## 2018-07-10 DIAGNOSIS — Z6835 Body mass index (BMI) 35.0-35.9, adult: Secondary | ICD-10-CM

## 2018-07-10 DIAGNOSIS — E669 Obesity, unspecified: Secondary | ICD-10-CM

## 2018-07-11 LAB — COMPLETE METABOLIC PANEL WITH GFR
AG RATIO: 1.2 (calc) (ref 1.0–2.5)
ALKALINE PHOSPHATASE (APISO): 65 U/L (ref 40–115)
ALT: 15 U/L (ref 9–46)
AST: 19 U/L (ref 10–35)
Albumin: 4.1 g/dL (ref 3.6–5.1)
BILIRUBIN TOTAL: 0.3 mg/dL (ref 0.2–1.2)
BUN: 16 mg/dL (ref 7–25)
CHLORIDE: 102 mmol/L (ref 98–110)
CO2: 26 mmol/L (ref 20–32)
Calcium: 9.2 mg/dL (ref 8.6–10.3)
Creat: 1.25 mg/dL (ref 0.70–1.33)
GFR, Est African American: 76 mL/min/{1.73_m2} (ref >=60)
GFR, Est Non African American: 66 mL/min/{1.73_m2} (ref >=60)
GLUCOSE: 85 mg/dL (ref 65–99)
Globulin: 3.4 g/dL (calc) (ref 1.9–3.7)
Potassium: 4.4 mmol/L (ref 3.5–5.3)
Sodium: 138 mmol/L (ref 135–146)
TOTAL PROTEIN: 7.5 g/dL (ref 6.1–8.1)

## 2018-07-11 LAB — CBC WITH DIFFERENTIAL/PLATELET
ABSOLUTE MONOCYTES: 470 {cells}/uL (ref 200–950)
Basophils Absolute: 50 cells/uL (ref 0–200)
Basophils Relative: 0.6 %
Eosinophils Absolute: 277 cells/uL (ref 15–500)
Eosinophils Relative: 3.3 %
HCT: 39.8 % (ref 38.5–50.0)
HEMOGLOBIN: 13.4 g/dL (ref 13.2–17.1)
Lymphs Abs: 2738 cells/uL (ref 850–3900)
MCH: 25.8 pg — ABNORMAL LOW (ref 27.0–33.0)
MCHC: 33.7 g/dL (ref 32.0–36.0)
MCV: 76.5 fL — ABNORMAL LOW (ref 80.0–100.0)
MPV: 8.9 fL (ref 7.5–12.5)
Monocytes Relative: 5.6 %
Neutro Abs: 4864 cells/uL (ref 1500–7800)
Neutrophils Relative %: 57.9 %
Platelets: 424 10*3/uL — ABNORMAL HIGH (ref 140–400)
RBC: 5.2 10*6/uL (ref 4.20–5.80)
RDW: 15 % (ref 11.0–15.0)
Total Lymphocyte: 32.6 %
WBC: 8.4 10*3/uL (ref 3.8–10.8)

## 2018-07-11 LAB — VITAMIN D 25 HYDROXY (VIT D DEFICIENCY, FRACTURES): VIT D 25 HYDROXY: 11 ng/mL — AB (ref 30–100)

## 2018-07-11 LAB — HEMOGLOBIN A1C
EAG (MMOL/L): 7.6 (calc)
HEMOGLOBIN A1C: 6.4 %{Hb} — AB (ref <5.7)
Mean Plasma Glucose: 137 (calc)

## 2018-07-11 LAB — LIPID PANEL
CHOL/HDL RATIO: 3.2 (calc) (ref <5.0)
Cholesterol: 164 mg/dL (ref <200)
HDL: 51 mg/dL (ref >40)
LDL Cholesterol (Calc): 93 mg/dL (calc)
Non-HDL Cholesterol (Calc): 113 mg/dL (calc) (ref <130)
Triglycerides: 102 mg/dL (ref <150)

## 2018-07-14 MED ORDER — VITAMIN D (ERGOCALCIFEROL) 1.25 MG (50000 UNIT) PO CAPS: 50000.0000 [IU] | ORAL_CAPSULE | ORAL | 0 refills | Status: DC

## 2018-12-04 ENCOUNTER — Other Ambulatory Visit: Payer: Self-pay | Admitting: Family Medicine

## 2018-12-25 ENCOUNTER — Other Ambulatory Visit: Payer: Self-pay

## 2019-01-06 ENCOUNTER — Encounter: Payer: Self-pay | Admitting: Family Medicine

## 2019-01-06 ENCOUNTER — Other Ambulatory Visit: Payer: Self-pay

## 2019-01-06 ENCOUNTER — Ambulatory Visit (INDEPENDENT_AMBULATORY_CARE_PROVIDER_SITE_OTHER): Payer: Commercial Managed Care - PPO | Admitting: Family Medicine

## 2019-01-06 VITALS — BP 124/72 | HR 80 | Temp 97.8°F | Resp 18 | Ht 73.0 in | Wt 262.0 lb

## 2019-01-06 DIAGNOSIS — R7303 Prediabetes: Secondary | ICD-10-CM

## 2019-01-06 DIAGNOSIS — R5383 Other fatigue: Secondary | ICD-10-CM

## 2019-01-06 DIAGNOSIS — E559 Vitamin D deficiency, unspecified: Secondary | ICD-10-CM

## 2019-01-06 DIAGNOSIS — Z6834 Body mass index (BMI) 34.0-34.9, adult: Secondary | ICD-10-CM

## 2019-01-06 DIAGNOSIS — J4520 Mild intermittent asthma, uncomplicated: Secondary | ICD-10-CM

## 2019-01-06 DIAGNOSIS — E661 Drug-induced obesity: Secondary | ICD-10-CM

## 2019-01-06 DIAGNOSIS — D649 Anemia, unspecified: Secondary | ICD-10-CM | POA: Diagnosis not present

## 2019-01-06 MED ORDER — VITAMIN D (ERGOCALCIFEROL) 1.25 MG (50000 UNIT) PO CAPS: 50000.0000 [IU] | ORAL_CAPSULE | ORAL | 0 refills | Status: DC

## 2019-01-06 MED ORDER — ALBUTEROL SULFATE HFA 108 (90 BASE) MCG/ACT IN AERS: 2.0000 | INHALATION_SPRAY | RESPIRATORY_TRACT | 2 refills | Status: DC | PRN

## 2019-01-06 NOTE — Progress Notes (Signed)
Patient ID: Eddie Glass, male    DOB: 10/19/1965, 53 y.o.   MRN: [id number]  PCP: Delsa Grana, PA-C  Chief Complaint  Patient presents with  . Diabetes    follow up    Subjective:   Eddie Glass is a 53 y.o. male, presents to clinic with CC of routine f/up for vit D deficiency, prediabetes, anemia, asthma, and obesity.  Asthma - he is no longer taking singulair, moved out of other house which was causing much worse asthma than his normal.  Rarely using SABA.  No exertional dyspnea or wheeze. Vit D deficiency - did not get prescription Vit D, not taking anything else over the counter. Anemia - he did take iron supplement for a short time.  His fatigue did get a little better, but he still doesn't feel completely back to his baseline.  He suspects that he had COVID over the holidays.  Inquires about antibody testing, which our clinic MD's have decided not to order at this time.     Obesity/weight - no weight changes from past visit ~6 months ago.  Before that he did adjust some lifestyle changes and he has adhered to them.  Better diet, less drinking and staying more active.  He was working out a little bit, but hes had a lot of changes over the past 6 months, he moved, he's working 3 jobs now too.   Wt Readings from Last 5 Encounters:  01/06/19 262 lb (118.8 kg)  07/01/18 261 lb 4 oz (118.5 kg)  03/19/18 272 lb (123.4 kg)  03/03/18 267 lb 6.4 oz (121.3 kg)  02/11/18 272 lb 8 oz (123.6 kg)     Patient Active Problem List   Diagnosis Date Noted  . Vitamin D deficiency 01/06/2019  . Prediabetes 07/01/2018  . Anemia 07/01/2018  . Asthma 02/11/2018  . Allergy 02/11/2018    Current Meds  Medication Sig  . ferrous sulfate 325 (65 FE) MG EC tablet Take 325 mg by mouth 3 (three) times daily with meals.  . montelukast (SINGULAIR) 10 MG tablet Take 1 tablet (10 mg total) by mouth at bedtime.  [employer] PROAIR HFA 108 (90 Base) MCG/ACT inhaler INHALE 2 PUFFS BY MOUTH EVERY 4 HOURS AS  NEEDED FOR WHEEZE OR FOR SHORTNESS OF BREATH  . vitamin B-12 (CYANOCOBALAMIN) 500 MCG tablet Take 500 mcg by mouth daily.  . Vitamin D, Ergocalciferol, (DRISDOL) 1.25 MG (50000 UT) CAPS capsule Take 1 capsule (50,000 Units total) by mouth every 7 (seven) days. x12 weeks.     Review of Systems  Constitutional: Negative.   HENT: Negative.   Eyes: Negative.   Respiratory: Negative.   Cardiovascular: Negative.   Gastrointestinal: Negative.   Endocrine: Negative.   Genitourinary: Negative.   Musculoskeletal: Negative.   Skin: Negative.   Allergic/Immunologic: Negative.   Neurological: Negative.   Hematological: Negative.   Psychiatric/Behavioral: Negative.   All other systems reviewed and are negative.      Objective:    Vitals:   01/06/19 1544  BP: 124/72  Pulse: 80  Resp: 18  Temp: 97.8 F (36.6 C)  SpO2: 96%  Weight: 262 lb (118.8 kg)  Height: 6\' 1"  (1.854 m)    Body mass index is 34.57 kg/m.    Physical Exam Vitals signs and nursing note reviewed.  Constitutional:      General: He is not in acute distress.    Appearance: He is well-developed. He is not ill-appearing, toxic-appearing or diaphoretic.  HENT:  Head: Normocephalic and atraumatic.     Right Ear: External ear normal.     Left Ear: External ear normal.     Nose: Nose normal.  Eyes:     General: No scleral icterus.       Right eye: No discharge.        Left eye: No discharge.     Conjunctiva/sclera: Conjunctivae normal.     Pupils: Pupils are equal, round, and reactive to light.  Neck:     Musculoskeletal: Normal range of motion.     Trachea: No tracheal deviation.  Cardiovascular:     Rate and Rhythm: Normal rate and regular rhythm.     Heart sounds: Normal heart sounds. No murmur. No friction rub. No gallop.   Pulmonary:     Effort: Pulmonary effort is normal. No respiratory distress.     Breath sounds: Normal breath sounds. No stridor. No wheezing, rhonchi or rales.  Musculoskeletal:  Normal range of motion.     Right lower leg: No edema.     Left lower leg: No edema.  Skin:    General: Skin is warm and dry.     Capillary Refill: Capillary refill takes less than 2 seconds.     Coloration: Skin is not jaundiced or pale.     Findings: No rash.  Neurological:     Mental Status: He is alert.     Motor: No weakness or abnormal muscle tone.     Coordination: Coordination normal.     Gait: Gait normal.  Psychiatric:        Behavior: Behavior normal.           Assessment & Plan:    53 y/o male here for routine follow up on the following dx:  Problem List Items Addressed This Visit      Respiratory   Mild intermittent asthma without complication Improved with moving away from past residence which triggered and exacerbated asthma Now only using SABA rarely F/up 6 months Refills on inhaler   Relevant Medications   albuterol (VENTOLIN HFA) 108 (90 Base) MCG/ACT inhaler     Other   Prediabetes - Primary    Recheck A1C      Relevant Orders   Hemoglobin A1c (Completed)   COMPLETE METABOLIC PANEL WITH GFR (Completed)   Anemia    H/H had improved with supplementation, still taking iron about 3x a week, tolerating w/o SE Recheck CBC      Relevant Orders   CBC with Differential/Platelet (Completed)   Vitamin D deficiency    Did not tx per previous plan, no Rx vit D or OTC Recheck, still having some fatigue, though better than before If still deficient, Rx Vit D replacement x 12 weeks and f/up appt and labs      Relevant Medications   Vitamin D, Ergocalciferol, (DRISDOL) 1.25 MG (50000 UT) CAPS capsule   Other Relevant Orders   VITAMIN D 25 Hydroxy (Vit-D Deficiency, Fractures) (Completed)   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   Handout  given. 6 month f/up      Relevant Orders   Hemoglobin A1c (Completed)   COMPLETE METABOLIC PANEL WITH GFR (Completed)   CBC with Differential/Platelet (Completed)   Vitamin B12 (Completed)    Other Visit Diagnoses  Fatigue, unspecified type       Relevant Medications   Vitamin D, Ergocalciferol, (DRISDOL) 1.25 MG (50000 UT) CAPS capsule   Other Relevant Orders   VITAMIN D 25 Hydroxy (Vit-D Deficiency, Fractures) (Completed)   Hemoglobin A1c (Completed)   COMPLETE METABOLIC PANEL WITH GFR (Completed)   CBC with Differential/Platelet (Completed)   Vitamin B12 (Completed)      He is concerned that fatigue in December was from him having COVID would like antibody testing, I have explained to him that we are not currently ordering that due to variety of test and unclear interpretation of results.  Vit D deficiency and anemia were definitely contributor to his sx.  He did improve his anemia with iron supplementation, but never took the Vit D prescribed.  Will recheck labs     Delsa Grana, PA-C 01/06/19 3:48 PM

## 2019-01-06 NOTE — Patient Instructions (Signed)
Return for your complete physical exam (morning appointment or fasting lab done before will be best)  Keep doing iron supplement as tolerated  Add on the once a week Vit D supplement  Do what you can with healthy diet and adding some aerobic exercise into your week.  I will call you with your lab results and anything that we need to do.    Vitamin D Deficiency Vitamin D deficiency is when your body does not have enough vitamin D. Vitamin D is important because:  It helps your body use other minerals that your body needs.  It helps keep your bones strong and healthy.  It may help to prevent some diseases.  It helps your heart and other muscles work well. You can get vitamin D by:  Eating foods with vitamin D in them.  Drinking or eating milk or other foods that have had vitamin D added to them.  Taking a vitamin D supplement.  Being in the sun. Not getting enough vitamin D can make your bones become soft. It can also cause other health problems. Follow these instructions at home:  Take medicines and supplements only as told by your doctor.  Eat foods that have vitamin D. These include: ? Dairy products, cereals, or juices with added vitamin D. Check the label for vitamin D. ? Fatty fish like salmon or trout. ? Eggs. ? Oysters.  Do not use tanning beds.  Stay at a healthy weight. Lose weight, if needed.  Keep all follow-up visits as told by your doctor. This is important. Contact a doctor if:  Your symptoms do not go away.  You feel sick to your stomach (nauseous).  Youthrow up (vomit).  You poop less often than usual or you have trouble pooping (constipation). This information is not intended to replace advice given to you by your health care provider. Make sure you discuss any questions you have with your health care provider. Document Released: 06/21/2011 Document Revised: 12/08/2015 Document Reviewed: 11/17/2014 Elsevier Interactive Patient Education   2019 [employer].

## 2019-01-07 LAB — COMPLETE METABOLIC PANEL WITH GFR
AG Ratio: 1.4 (calc) (ref 1.0–2.5)
ALT: 11 U/L (ref 9–46)
AST: 22 U/L (ref 10–35)
Albumin: 4.3 g/dL (ref 3.6–5.1)
Alkaline phosphatase (APISO): 66 U/L (ref 35–144)
BUN: 14 mg/dL (ref 7–25)
CO2: 26 mmol/L (ref 20–32)
Calcium: 9.9 mg/dL (ref 8.6–10.3)
Chloride: 103 mmol/L (ref 98–110)
Creat: 1.29 mg/dL (ref 0.70–1.33)
GFR, Est African American: 73 mL/min/{1.73_m2} (ref >=60)
GFR, Est Non African American: 63 mL/min/{1.73_m2} (ref >=60)
Globulin: 3.1 g/dL (calc) (ref 1.9–3.7)
Glucose, Bld: 87 mg/dL (ref 65–99)
Potassium: 4.2 mmol/L (ref 3.5–5.3)
Sodium: 138 mmol/L (ref 135–146)
Total Bilirubin: 0.4 mg/dL (ref 0.2–1.2)
Total Protein: 7.4 g/dL (ref 6.1–8.1)

## 2019-01-07 LAB — CBC WITH DIFFERENTIAL/PLATELET
Absolute Monocytes: 683 cells/uL (ref 200–950)
Basophils Absolute: 74 cells/uL (ref 0–200)
Basophils Relative: 0.7 %
Eosinophils Absolute: 378 cells/uL (ref 15–500)
Eosinophils Relative: 3.6 %
HCT: 40 % (ref 38.5–50.0)
Hemoglobin: 13.7 g/dL (ref 13.2–17.1)
Lymphs Abs: 2898 cells/uL (ref 850–3900)
MCH: 26.6 pg — ABNORMAL LOW (ref 27.0–33.0)
MCHC: 34.3 g/dL (ref 32.0–36.0)
MCV: 77.7 fL — ABNORMAL LOW (ref 80.0–100.0)
MPV: 9.5 fL (ref 7.5–12.5)
Monocytes Relative: 6.5 %
Neutro Abs: 6468 cells/uL (ref 1500–7800)
Neutrophils Relative %: 61.6 %
Platelets: 359 10*3/uL (ref 140–400)
RBC: 5.15 10*6/uL (ref 4.20–5.80)
RDW: 15.3 % — ABNORMAL HIGH (ref 11.0–15.0)
Total Lymphocyte: 27.6 %
WBC: 10.5 10*3/uL (ref 3.8–10.8)

## 2019-01-07 LAB — HEMOGLOBIN A1C
Hgb A1c MFr Bld: 6.2 % of total Hgb — ABNORMAL HIGH (ref <5.7)
Mean Plasma Glucose: 131 (calc)
eAG (mmol/L): 7.3 (calc)

## 2019-01-07 LAB — VITAMIN D 25 HYDROXY (VIT D DEFICIENCY, FRACTURES): Vit D, 25-Hydroxy: 19 ng/mL — ABNORMAL LOW (ref 30–100)

## 2019-01-07 LAB — VITAMIN B12: Vitamin B-12: 488 pg/mL (ref 200–1100)

## 2019-01-13 DIAGNOSIS — E661 Drug-induced obesity: Secondary | ICD-10-CM | POA: Insufficient documentation

## 2019-01-13 DIAGNOSIS — J4520 Mild intermittent asthma, uncomplicated: Secondary | ICD-10-CM | POA: Insufficient documentation

## 2019-01-13 DIAGNOSIS — Z6834 Body mass index (BMI) 34.0-34.9, adult: Secondary | ICD-10-CM | POA: Insufficient documentation

## 2019-01-13 DIAGNOSIS — J4540 Moderate persistent asthma, uncomplicated: Secondary | ICD-10-CM | POA: Insufficient documentation

## 2019-01-13 NOTE — Assessment & Plan Note (Signed)
Weight stable from 6 months ago Encouraged healthy diet and increased aerobic exercise, discussed metabolic diseases and increased risk of heart disease, DM, HTN, HLD.   Handout given. 6 month f/up

## 2019-01-13 NOTE — Assessment & Plan Note (Signed)
H/H had improved with supplementation, still taking iron about 3x a week, tolerating w/o SE Recheck CBC

## 2019-01-13 NOTE — Assessment & Plan Note (Signed)
Did not tx per previous plan, no Rx vit D or OTC Recheck, still having some fatigue, though better than before If still deficient, Rx Vit D replacement x 12 weeks and f/up appt and labs

## 2019-01-13 NOTE — Assessment & Plan Note (Signed)
Improved with moving away from past residence which triggered and exacerbated asthma Now only using SABA rarely F/up 6 months Refills on inhaler

## 2019-01-13 NOTE — Assessment & Plan Note (Signed)
Recheck A1C 

## 2019-01-15 ENCOUNTER — Other Ambulatory Visit: Payer: Commercial Managed Care - PPO

## 2019-01-15 ENCOUNTER — Other Ambulatory Visit: Payer: Self-pay

## 2019-01-15 DIAGNOSIS — R7989 Other specified abnormal findings of blood chemistry: Secondary | ICD-10-CM

## 2019-01-15 DIAGNOSIS — R5383 Other fatigue: Secondary | ICD-10-CM

## 2019-01-16 LAB — TESTOSTERONE: Testosterone: 159 ng/dL — ABNORMAL LOW (ref 250–827)

## 2019-01-18 NOTE — Progress Notes (Signed)
Low testosterone, second testosterone lab order

## 2019-01-22 ENCOUNTER — Other Ambulatory Visit: Payer: Commercial Managed Care - PPO

## 2019-01-22 ENCOUNTER — Other Ambulatory Visit: Payer: Self-pay

## 2019-01-22 DIAGNOSIS — R7989 Other specified abnormal findings of blood chemistry: Secondary | ICD-10-CM

## 2019-01-22 DIAGNOSIS — R5383 Other fatigue: Secondary | ICD-10-CM

## 2019-01-23 ENCOUNTER — Encounter: Payer: Self-pay | Admitting: Family Medicine

## 2019-01-23 ENCOUNTER — Other Ambulatory Visit: Payer: Self-pay | Admitting: Family Medicine

## 2019-01-23 DIAGNOSIS — R7989 Other specified abnormal findings of blood chemistry: Secondary | ICD-10-CM | POA: Insufficient documentation

## 2019-01-27 LAB — TEST AUTHORIZATION 2

## 2019-01-27 LAB — FSH/LH
FSH: 7.8 m[IU]/mL (ref 1.6–8.0)
LH: 4.7 m[IU]/mL (ref 1.5–9.3)

## 2019-01-27 LAB — TESTOSTERONE: Testosterone: 136 ng/dL — ABNORMAL LOW (ref 250–827)

## 2019-02-16 ENCOUNTER — Telehealth: Payer: Self-pay

## 2019-02-16 NOTE — Telephone Encounter (Signed)
Error

## 2019-02-19 NOTE — Addendum Note (Signed)
Addended by: Vanice Sarah on: 02/19/2019 02:58 PM   Modules accepted: Orders

## 2019-08-13 ENCOUNTER — Other Ambulatory Visit: Payer: Self-pay | Admitting: Family Medicine

## 2019-08-27 ENCOUNTER — Other Ambulatory Visit: Payer: Self-pay | Admitting: Nurse Practitioner

## 2019-08-27 DIAGNOSIS — E559 Vitamin D deficiency, unspecified: Secondary | ICD-10-CM

## 2019-08-27 DIAGNOSIS — J45901 Unspecified asthma with (acute) exacerbation: Secondary | ICD-10-CM

## 2019-08-27 DIAGNOSIS — R7989 Other specified abnormal findings of blood chemistry: Secondary | ICD-10-CM

## 2019-08-27 NOTE — Telephone Encounter (Signed)
Patient is asking for a nebulizer  [facility] [address]

## 2019-08-31 ENCOUNTER — Ambulatory Visit (INDEPENDENT_AMBULATORY_CARE_PROVIDER_SITE_OTHER): Payer: Commercial Managed Care - PPO | Admitting: Nurse Practitioner

## 2019-08-31 ENCOUNTER — Encounter: Payer: Self-pay | Admitting: Nurse Practitioner

## 2019-08-31 ENCOUNTER — Other Ambulatory Visit: Payer: Self-pay

## 2019-08-31 VITALS — BP 144/90 | HR 66 | Temp 97.6°F | Resp 18 | Ht 73.0 in | Wt 276.4 lb

## 2019-08-31 DIAGNOSIS — D649 Anemia, unspecified: Secondary | ICD-10-CM

## 2019-08-31 DIAGNOSIS — R0689 Other abnormalities of breathing: Secondary | ICD-10-CM

## 2019-08-31 DIAGNOSIS — E785 Hyperlipidemia, unspecified: Secondary | ICD-10-CM

## 2019-08-31 DIAGNOSIS — Z23 Encounter for immunization: Secondary | ICD-10-CM

## 2019-08-31 DIAGNOSIS — Z0000 Encounter for general adult medical examination without abnormal findings: Secondary | ICD-10-CM

## 2019-08-31 DIAGNOSIS — U071 COVID-19: Secondary | ICD-10-CM

## 2019-08-31 DIAGNOSIS — R7989 Other specified abnormal findings of blood chemistry: Secondary | ICD-10-CM

## 2019-08-31 DIAGNOSIS — E559 Vitamin D deficiency, unspecified: Secondary | ICD-10-CM

## 2019-08-31 DIAGNOSIS — Z8616 Personal history of COVID-19: Secondary | ICD-10-CM | POA: Insufficient documentation

## 2019-08-31 DIAGNOSIS — R7303 Prediabetes: Secondary | ICD-10-CM

## 2019-08-31 HISTORY — DX: COVID-19: U07.1

## 2019-08-31 MED ORDER — ALBUTEROL SULFATE (2.5 MG/3ML) 0.083% IN NEBU: 2.5000 mg | INHALATION_SOLUTION | Freq: Four times a day (QID) | RESPIRATORY_TRACT | 1 refills | Status: DC | PRN

## 2019-08-31 MED ORDER — IPRATROPIUM-ALBUTEROL 0.5-2.5 (3) MG/3ML IN SOLN: 3.0000 mL | Freq: Once | RESPIRATORY_TRACT | Status: AC

## 2019-08-31 NOTE — Patient Instructions (Signed)
Follow up Thursday for re-examination.

## 2019-08-31 NOTE — Telephone Encounter (Signed)
Pt is coming in for cpe today at 3. We can address then.

## 2019-08-31 NOTE — Telephone Encounter (Signed)
I have never treated this pt nor do I see h/o lung condition. Please find out more inforamtion. Does the pt need a telephone encounter?

## 2019-08-31 NOTE — Progress Notes (Signed)
Established Patient Office Visit  Subjective:  Patient ID: Eddie Glass, male    DOB: 05-08-1966  Age: 54 y.o. MRN: [id number]  CC:  Chief Complaint  Patient presents with  . Annual Exam    HPI Eddie Glass is a 54 y.o. A.A male presenting for AWV. Today he reports that he had COVID infection and tested negative 2 weeks ago. He continues to feel mildly sob with using albuterol inhalers. He would like nebulized treatment alternately. Denied GU/GI sxs, CP/CT, cough, palpitations, pain, or falls.  Disease prevention screenings utd other than influenza as addressed.    Past Medical History:  Diagnosis Date  . Allergy    seasonal  . Anemia   . Asthma   . COVID-19 08/31/2019  . Hyperlipidemia 07/01/2018    No past surgical history on file.  Family History  Problem Relation Age of Onset  . Diabetes Mother   . Cancer Father        lung cancer  . Colon cancer Neg Hx   . Esophageal cancer Neg Hx   . Rectal cancer Neg Hx   . Stomach cancer Neg Hx     Social History   Socioeconomic History  . Marital status: Single    Spouse name: Not on file  . Number of children: 1  . Years of education: Not on file  . Highest education level: Not on file  Occupational History  . Not on file  Tobacco Use  . Smoking status: Never Smoker  . Smokeless tobacco: Never Used  Substance and Sexual Activity  . Alcohol use: Yes    Alcohol/week: 5.0 standard drinks    Types: 5 Standard drinks or equivalent per week    Comment: 0-3 times a week, social, 2-3 drinks of everything, burbon club  . Drug use: Never  . Sexual activity: Not Currently  Other Topics Concern  . Not on file  Social History Narrative  . Not on file   Social Determinants of Health   Financial Resource Strain:   . Difficulty of Paying Living Expenses: Not on file  Food Insecurity:   . Worried About Charity fundraiser in the Last Year: Not on file  . Ran Out of Food in the Last Year: Not on file  Transportation  Needs:   . Lack of Transportation (Medical): Not on file  . Lack of Transportation (Non-Medical): Not on file  Physical Activity:   . Days of Exercise per Week: Not on file  . Minutes of Exercise per Session: Not on file  Stress:   . Feeling of Stress : Not on file  Social Connections:   . Frequency of Communication with Friends and Family: Not on file  . Frequency of Social Gatherings with Friends and Family: Not on file  . Attends Religious Services: Not on file  . Active Member of Clubs or Organizations: Not on file  . Attends Archivist Meetings: Not on file  . Marital Status: Not on file  Intimate Partner Violence:   . Fear of Current or Ex-Partner: Not on file  . Emotionally Abused: Not on file  . Physically Abused: Not on file  . Sexually Abused: Not on file    Outpatient Medications Prior to Visit  Medication Sig Dispense Refill  . ferrous sulfate 325 (65 FE) MG EC tablet Take 325 mg by mouth 3 (three) times daily with meals.    . montelukast (SINGULAIR) 10 MG tablet Take 1 tablet (10 mg  total) by mouth at bedtime. 90 tablet 3  . vitamin B-12 (CYANOCOBALAMIN) 500 MCG tablet Take 500 mcg by mouth daily.    . Vitamin D, Ergocalciferol, (DRISDOL) 1.25 MG (50000 UT) CAPS capsule Take 1 capsule (50,000 Units total) by mouth every 7 (seven) days. x12 weeks. 12 capsule 0  . albuterol (VENTOLIN HFA) 108 (90 Base) MCG/ACT inhaler Inhale 2 puffs into the lungs every 4 (four) hours as needed for wheezing or shortness of breath. 18 g 2  . albuterol (VENTOLIN HFA) 108 (90 Base) MCG/ACT inhaler INHALE 2 PUFFS BY MOUTH EVERY 4 HOURS AS NEEDED FOR WHEEZE OR FOR SHORTNESS OF BREATH 8.5 g 2   No facility-administered medications prior to visit.    Allergies  Allergen Reactions  . Other Anaphylaxis    Cats    ROS Review of Systems  All other systems reviewed and are negative.     Objective:    Physical Exam  Constitutional: He is oriented to person, place, and time. He  appears well-developed and well-nourished.  HENT:  Head: Normocephalic.  Right Ear: Hearing, tympanic membrane, external ear and ear canal normal.  Left Ear: Hearing, tympanic membrane, external ear and ear canal normal.  Nose: Nose normal. No mucosal edema, rhinorrhea, sinus tenderness, nasal deformity or septal deviation.  Mouth/Throat: Uvula is midline, oropharynx is clear and moist and mucous membranes are normal.  Eyes: Pupils are equal, round, and reactive to light. Conjunctivae, EOM and lids are normal. Lids are everted and swept, no foreign bodies found.  Neck: Trachea normal. No thyroid mass and no thyromegaly present.  Cardiovascular: Normal rate, regular rhythm, S1 normal, S2 normal and normal heart sounds.  Pulmonary/Chest: Effort normal. No accessory muscle usage. No respiratory distress. He has decreased breath sounds in the right upper field and the left upper field.  Decreased breath sounds  Post duo neb increases air movement, no adventitious breath sounds.  Abdominal: Soft. Normal aorta and bowel sounds are normal. There is no hepatosplenomegaly, splenomegaly or hepatomegaly. There is no abdominal tenderness. There is no rebound and no CVA tenderness. No hernia. Hernia confirmed negative in the ventral area, confirmed negative in the right inguinal area and confirmed negative in the left inguinal area.  Genitourinary:    Genitourinary Comments: Deferred by pt   Musculoskeletal:        General: Normal range of motion.     Cervical back: Normal range of motion and neck supple.  Lymphadenopathy:       Head (right side): Tonsillar adenopathy present. No submental, no submandibular, no preauricular, no posterior auricular and no occipital adenopathy present.       Head (left side): Tonsillar adenopathy present. No submental, no submandibular, no preauricular, no posterior auricular and no occipital adenopathy present.    He has cervical adenopathy.       Right cervical:  Superficial cervical adenopathy present. No deep cervical adenopathy present.      Left cervical: Superficial cervical adenopathy present. No deep cervical adenopathy present.  Neurological: He is alert and oriented to person, place, and time. He has normal strength. He displays a negative Romberg sign.  Skin: Skin is warm, dry and intact.  Psychiatric: He has a normal mood and affect. His speech is normal and behavior is normal. Judgment and thought content normal. Cognition and memory are normal.    BP (!) 144/90 (BP Location: Left Arm, Patient Position: Sitting, Cuff Size: Large)   Pulse 66   Temp 97.6 F (36.4 C) (Oral)  Resp 18   Ht 6\' 1"  (1.854 m)   Wt 276 lb 6.4 oz (125.4 kg)   SpO2 97%   BMI 36.47 kg/m  Wt Readings from Last 3 Encounters:  08/31/19 276 lb 6.4 oz (125.4 kg)  01/06/19 262 lb (118.8 kg)  07/01/18 261 lb 4 oz (118.5 kg)    No results found for: TSH Lab Results  Component Value Date   WBC 10.5 01/06/2019   HGB 13.7 01/06/2019   HCT 40.0 01/06/2019   MCV 77.7 (L) 01/06/2019   PLT 359 01/06/2019   Lab Results  Component Value Date   NA 138 01/06/2019   K 4.2 01/06/2019   CO2 26 01/06/2019   GLUCOSE 87 01/06/2019   BUN 14 01/06/2019   CREATININE 1.29 01/06/2019   BILITOT 0.4 01/06/2019   AST 22 01/06/2019   ALT 11 01/06/2019   PROT 7.4 01/06/2019   CALCIUM 9.9 01/06/2019   Lab Results  Component Value Date   CHOL 164 07/10/2018   Lab Results  Component Value Date   HDL 51 07/10/2018   Lab Results  Component Value Date   LDLCALC 93 07/10/2018   Lab Results  Component Value Date   TRIG 102 07/10/2018   Lab Results  Component Value Date   CHOLHDL 3.2 07/10/2018   Lab Results  Component Value Date   HGBA1C 6.2 (H) 01/06/2019      Assessment & Plan:  We gave you a nebulizer in clinic today for your decreased lung sounds. I have sent RX to pharmacy and would like for your to follow up Thursday to re-examine.   I have ordered a  chest XRAY please complete as soon as possible.   Go to the ER for worsening symptoms.    Problem List Items Addressed This Visit      Other   Prediabetes   Relevant Orders   Hemoglobin A1c   Anemia   Relevant Orders   CBC with Differential/Platelet   Vitamin D deficiency   Relevant Orders   VITAMIN D 25 Hydroxy (Vit-D Deficiency, Fractures)   Low testosterone   Relevant Orders   Testosterone   COVID-19   Relevant Medications   albuterol (PROVENTIL) (2.5 MG/3ML) 0.083% nebulizer solution   Other Relevant Orders   For home use only DME Nebulizer machine    Other Visit Diagnoses    PE (physical exam), annual    -  Primary   Relevant Orders   CBC with Differential/Platelet   COMPLETE METABOLIC PANEL WITH GFR   Lipid panel   Hemoglobin A1c   Need for vaccination       Hyperlipidemia, unspecified hyperlipidemia type       Relevant Orders   Lipid panel   Decreased breath sounds at right lung base       Relevant Medications   ipratropium-albuterol (DUONEB) 0.5-2.5 (3) MG/3ML nebulizer solution 3 mL   albuterol (PROVENTIL) (2.5 MG/3ML) 0.083% nebulizer solution   Other Relevant Orders   DG Chest 2 View   For home use only DME Nebulizer machine      Meds ordered this encounter  Medications  . ipratropium-albuterol (DUONEB) 0.5-2.5 (3) MG/3ML nebulizer solution 3 mL  . albuterol (PROVENTIL) (2.5 MG/3ML) 0.083% nebulizer solution    Sig: Take 3 mLs (2.5 mg total) by nebulization every 6 (six) hours as needed for wheezing or shortness of breath.    Dispense:  150 mL    Refill:  1    Follow-up: Return in about  3 days (around 09/03/2019).  Go to ER for worsening symptoms.   Annie Main, FNP

## 2019-09-01 ENCOUNTER — Other Ambulatory Visit: Payer: Self-pay | Admitting: Nurse Practitioner

## 2019-09-01 DIAGNOSIS — R0689 Other abnormalities of breathing: Secondary | ICD-10-CM

## 2019-09-01 DIAGNOSIS — J45909 Unspecified asthma, uncomplicated: Secondary | ICD-10-CM

## 2019-09-01 DIAGNOSIS — U071 COVID-19: Secondary | ICD-10-CM

## 2019-09-01 LAB — LIPID PANEL
Cholesterol: 165 mg/dL (ref <200)
HDL: 42 mg/dL (ref >=40)
LDL Cholesterol (Calc): 98 mg/dL (calc)
Non-HDL Cholesterol (Calc): 123 mg/dL (calc) (ref <130)
Total CHOL/HDL Ratio: 3.9 (calc) (ref <5.0)
Triglycerides: 149 mg/dL (ref <150)

## 2019-09-01 LAB — HEMOGLOBIN A1C
Hgb A1c MFr Bld: 6.6 % of total Hgb — ABNORMAL HIGH (ref <5.7)
Mean Plasma Glucose: 143 (calc)
eAG (mmol/L): 7.9 (calc)

## 2019-09-01 LAB — COMPLETE METABOLIC PANEL WITH GFR
AG Ratio: 1.4 (calc) (ref 1.0–2.5)
ALT: 15 U/L (ref 9–46)
AST: 21 U/L (ref 10–35)
Albumin: 4 g/dL (ref 3.6–5.1)
Alkaline phosphatase (APISO): 72 U/L (ref 35–144)
BUN: 21 mg/dL (ref 7–25)
CO2: 25 mmol/L (ref 20–32)
Calcium: 9 mg/dL (ref 8.6–10.3)
Chloride: 104 mmol/L (ref 98–110)
Creat: 1.31 mg/dL (ref 0.70–1.33)
GFR, Est African American: 72 mL/min/{1.73_m2} (ref >=60)
GFR, Est Non African American: 62 mL/min/{1.73_m2} (ref >=60)
Globulin: 2.9 g/dL (calc) (ref 1.9–3.7)
Glucose, Bld: 85 mg/dL (ref 65–99)
Potassium: 4.2 mmol/L (ref 3.5–5.3)
Sodium: 140 mmol/L (ref 135–146)
Total Bilirubin: 0.3 mg/dL (ref 0.2–1.2)
Total Protein: 6.9 g/dL (ref 6.1–8.1)

## 2019-09-01 LAB — CBC WITH DIFFERENTIAL/PLATELET
Absolute Monocytes: 659 cells/uL (ref 200–950)
Basophils Absolute: 62 cells/uL (ref 0–200)
Basophils Relative: 0.7 %
Eosinophils Absolute: 659 cells/uL — ABNORMAL HIGH (ref 15–500)
Eosinophils Relative: 7.4 %
HCT: 38.7 % (ref 38.5–50.0)
Hemoglobin: 12.9 g/dL — ABNORMAL LOW (ref 13.2–17.1)
Lymphs Abs: 2563 cells/uL (ref 850–3900)
MCH: 25.9 pg — ABNORMAL LOW (ref 27.0–33.0)
MCHC: 33.3 g/dL (ref 32.0–36.0)
MCV: 77.7 fL — ABNORMAL LOW (ref 80.0–100.0)
MPV: 9.3 fL (ref 7.5–12.5)
Monocytes Relative: 7.4 %
Neutro Abs: 4957 cells/uL (ref 1500–7800)
Neutrophils Relative %: 55.7 %
Platelets: 300 10*3/uL (ref 140–400)
RBC: 4.98 10*6/uL (ref 4.20–5.80)
RDW: 15.1 % — ABNORMAL HIGH (ref 11.0–15.0)
Total Lymphocyte: 28.8 %
WBC: 8.9 10*3/uL (ref 3.8–10.8)

## 2019-09-01 LAB — VITAMIN D 25 HYDROXY (VIT D DEFICIENCY, FRACTURES): Vit D, 25-Hydroxy: 25 ng/mL — ABNORMAL LOW (ref 30–100)

## 2019-09-01 LAB — TESTOSTERONE: Testosterone: 119 ng/dL — ABNORMAL LOW (ref 250–827)

## 2019-09-01 MED ORDER — FLUTICASONE-SALMETEROL 250-50 MCG/DOSE IN AEPB: 1.0000 | INHALATION_SPRAY | Freq: Two times a day (BID) | RESPIRATORY_TRACT | 3 refills | Status: DC

## 2019-09-01 MED ORDER — PREDNISONE 10 MG PO TABS: ORAL_TABLET | ORAL | 0 refills | Status: DC

## 2019-09-01 MED ORDER — ALBUTEROL SULFATE (2.5 MG/3ML) 0.083% IN NEBU: 2.5000 mg | INHALATION_SOLUTION | Freq: Four times a day (QID) | RESPIRATORY_TRACT | 1 refills | Status: DC | PRN

## 2019-09-01 NOTE — Progress Notes (Signed)
Please let the pt know. Have him write down! Pts labs showed low testosterone levels, I have ordered more labs. I would like them to be drawn between 8-10 AM.   I would like the pt to continue or start taking the following:   ferrous sulfate 325 (65 FE) MG EC tablet,  montelukast (SINGULAIR) 10 MG tablet Vitamin B12 500 mcg tablets daily Vitamin D 1000 units of vitamin D3 daily   I have prescribed Advair in haler to add to the nebulizer's I prescribed yesterday.  I have Rx'd prednisone for the pt for 6 days.   I would still ike him to f/u as planned.

## 2019-09-03 ENCOUNTER — Ambulatory Visit: Payer: Commercial Managed Care - PPO | Admitting: Nurse Practitioner

## 2019-09-07 ENCOUNTER — Ambulatory Visit: Payer: Commercial Managed Care - PPO | Admitting: Nurse Practitioner

## 2019-09-07 NOTE — Progress Notes (Deleted)
No show

## 2019-09-09 ENCOUNTER — Ambulatory Visit (INDEPENDENT_AMBULATORY_CARE_PROVIDER_SITE_OTHER): Payer: Commercial Managed Care - PPO | Admitting: Nurse Practitioner

## 2019-09-09 ENCOUNTER — Other Ambulatory Visit: Payer: Self-pay

## 2019-09-09 VITALS — BP 140/78 | HR 79 | Temp 97.9°F | Resp 18 | Wt 278.0 lb

## 2019-09-09 DIAGNOSIS — Z09 Encounter for follow-up examination after completed treatment for conditions other than malignant neoplasm: Secondary | ICD-10-CM

## 2019-09-09 DIAGNOSIS — J45909 Unspecified asthma, uncomplicated: Secondary | ICD-10-CM

## 2019-09-09 NOTE — Progress Notes (Signed)
Acute Office Visit  Subjective:    Patient ID: Eddie Glass, male    DOB: 1965/12/16, 54 y.o.   MRN: [id number]  Chief Complaint: follow up  HPI Patient is a 54 y.o. male presenting for follow up after treatment for asthma exacerbation post COVID. Today the pt reports after a year in a half he feels great without any wheezing or shortness of breath. No cp/ct, gu/gi sxs, sob, pain, or recent falls.   Past Medical History:  Diagnosis Date  . Allergy    seasonal  . Anemia   . Asthma   . COVID-19 08/31/2019  . Hyperlipidemia 07/01/2018    No past surgical history on file.  Family History  Problem Relation Age of Onset  . Diabetes Mother   . Cancer Father        lung cancer  . Colon cancer Neg Hx   . Esophageal cancer Neg Hx   . Rectal cancer Neg Hx   . Stomach cancer Neg Hx     Social History   Socioeconomic History  . Marital status: Single    Spouse name: Not on file  . Number of children: 1  . Years of education: Not on file  . Highest education level: Not on file  Occupational History  . Not on file  Tobacco Use  . Smoking status: Never Smoker  . Smokeless tobacco: Never Used  Substance and Sexual Activity  . Alcohol use: Yes    Alcohol/week: 5.0 standard drinks    Types: 5 Standard drinks or equivalent per week    Comment: 0-3 times a week, social, 2-3 drinks of everything, burbon club  . Drug use: Never  . Sexual activity: Not Currently  Other Topics Concern  . Not on file  Social History Narrative  . Not on file   Social Determinants of Health   Financial Resource Strain:   . Difficulty of Paying Living Expenses: Not on file  Food Insecurity:   . Worried About Charity fundraiser in the Last Year: Not on file  . Ran Out of Food in the Last Year: Not on file  Transportation Needs:   . Lack of Transportation (Medical): Not on file  . Lack of Transportation (Non-Medical): Not on file  Physical Activity:   . Days of Exercise per Week: Not on file    . Minutes of Exercise per Session: Not on file  Stress:   . Feeling of Stress : Not on file  Social Connections:   . Frequency of Communication with Friends and Family: Not on file  . Frequency of Social Gatherings with Friends and Family: Not on file  . Attends Religious Services: Not on file  . Active Member of Clubs or Organizations: Not on file  . Attends Archivist Meetings: Not on file  . Marital Status: Not on file  Intimate Partner Violence:   . Fear of Current or Ex-Partner: Not on file  . Emotionally Abused: Not on file  . Physically Abused: Not on file  . Sexually Abused: Not on file    Outpatient Medications Prior to Visit  Medication Sig Dispense Refill  . albuterol (PROVENTIL) (2.5 MG/3ML) 0.083% nebulizer solution Take 3 mLs (2.5 mg total) by nebulization every 6 (six) hours as needed for wheezing or shortness of breath. 150 mL 1  . ferrous sulfate 325 (65 FE) MG EC tablet Take 325 mg by mouth 3 (three) times daily with meals.    . Fluticasone-Salmeterol (ADVAIR  DISKUS) 250-50 MCG/DOSE AEPB Inhale 1 puff into the lungs 2 (two) times daily. 1 each 3  . montelukast (SINGULAIR) 10 MG tablet Take 1 tablet (10 mg total) by mouth at bedtime. 90 tablet 3  . predniSONE (DELTASONE) 10 MG tablet Day one: 6 tabs (60mg ) Day two: 5 tabs (50mg ) Day three: 4 tabs (40mg ) Day four: 3 tabs (30mg ) Day five: 2 tabs (20mg ) Day six: 1 tab (10mg ) 21 tablet 0  . vitamin B-12 (CYANOCOBALAMIN) 500 MCG tablet Take 500 mcg by mouth daily.    . Vitamin D, Ergocalciferol, (DRISDOL) 1.25 MG (50000 UT) CAPS capsule Take 1 capsule (50,000 Units total) by mouth every 7 (seven) days. x12 weeks. 12 capsule 0   Facility-Administered Medications Prior to Visit  Medication Dose Route Frequency Provider Last Rate Last Admin  . ipratropium-albuterol (DUONEB) 0.5-2.5 (3) MG/3ML nebulizer solution 3 mL  3 mL Nebulization Once Ishmael Holter A, FNP        Allergies  Allergen Reactions  . Other  Anaphylaxis    Cats    Review of Systems  All other systems reviewed and are negative.      Objective:    Physical Exam Vitals and nursing note reviewed.  Constitutional:      Appearance: Normal appearance.  HENT:     Head: Normocephalic.     Right Ear: External ear normal.     Left Ear: External ear normal.     Nose: Nose normal.  Eyes:     Extraocular Movements: Extraocular movements intact.     Conjunctiva/sclera: Conjunctivae normal.     Pupils: Pupils are equal, round, and reactive to light.  Cardiovascular:     Rate and Rhythm: Normal rate and regular rhythm.     Pulses: Normal pulses.     Heart sounds: Normal heart sounds, S1 normal and S2 normal.  Pulmonary:     Effort: Pulmonary effort is normal.     Breath sounds: Normal breath sounds and air entry. No stridor, decreased air movement or transmitted upper airway sounds.  Musculoskeletal:     Cervical back: Normal range of motion and neck supple.  Skin:    General: Skin is warm and dry.     Capillary Refill: Capillary refill takes less than 2 seconds.  Neurological:     General: No focal deficit present.     Mental Status: He is alert and oriented to person, place, and time.  Psychiatric:        Attention and Perception: Attention and perception normal.        Mood and Affect: Mood normal.        Behavior: Behavior normal.        Thought Content: Thought content normal.        Judgment: Judgment normal.     BP 140/78 (BP Location: Left Arm, Patient Position: Sitting, Cuff Size: Large)   Pulse 79   Temp 97.9 F (36.6 C) (Oral)   Resp 18   Wt 278 lb (126.1 kg)   SpO2 96%   BMI 36.68 kg/m  Wt Readings from Last 3 Encounters:  09/09/19 278 lb (126.1 kg)  08/31/19 276 lb 6.4 oz (125.4 kg)  01/06/19 262 lb (118.8 kg)    There are no preventive care reminders to display for this patient.  There are no preventive care reminders to display for this patient.   No results found for: TSH Lab Results    Component Value Date   WBC 8.9 08/31/2019  HGB 12.9 (L) 08/31/2019   HCT 38.7 08/31/2019   MCV 77.7 (L) 08/31/2019   PLT 300 08/31/2019   Lab Results  Component Value Date   NA 140 08/31/2019   K 4.2 08/31/2019   CO2 25 08/31/2019   GLUCOSE 85 08/31/2019   BUN 21 08/31/2019   CREATININE 1.31 08/31/2019   BILITOT 0.3 08/31/2019   AST 21 08/31/2019   ALT 15 08/31/2019   PROT 6.9 08/31/2019   CALCIUM 9.0 08/31/2019   Lab Results  Component Value Date   CHOL 165 08/31/2019   Lab Results  Component Value Date   HDL 42 08/31/2019   Lab Results  Component Value Date   LDLCALC 98 08/31/2019   Lab Results  Component Value Date   TRIG 149 08/31/2019   Lab Results  Component Value Date   CHOLHDL 3.9 08/31/2019   Lab Results  Component Value Date   HGBA1C 6.6 (H) 08/31/2019       Assessment & Plan:   Seek medical attention for asthma exacerbation, continue treatment plan.     Problem List Items Addressed This Visit      Respiratory   Asthma    Other Visit Diagnoses    Follow-up exam after treatment    -  Primary     Follow up as planned. Follow up in about 6 months as we planned on your initial establish care visit.   No orders of the defined types were placed in this encounter.  Annie Main, FNP

## 2019-09-10 ENCOUNTER — Other Ambulatory Visit: Payer: Self-pay | Admitting: Nurse Practitioner

## 2019-09-10 DIAGNOSIS — U071 COVID-19: Secondary | ICD-10-CM

## 2019-09-25 ENCOUNTER — Other Ambulatory Visit: Payer: Self-pay

## 2019-09-25 MED ORDER — ALBUTEROL SULFATE HFA 108 (90 BASE) MCG/ACT IN AERS: 1.0000 | INHALATION_SPRAY | Freq: Four times a day (QID) | RESPIRATORY_TRACT | 1 refills | Status: DC | PRN

## 2019-11-18 ENCOUNTER — Other Ambulatory Visit: Payer: Self-pay | Admitting: Nurse Practitioner

## 2019-12-03 ENCOUNTER — Other Ambulatory Visit: Payer: Self-pay | Admitting: Nurse Practitioner

## 2019-12-03 DIAGNOSIS — J45901 Unspecified asthma with (acute) exacerbation: Secondary | ICD-10-CM

## 2019-12-03 NOTE — Telephone Encounter (Signed)
Ok to fill 

## 2019-12-18 ENCOUNTER — Other Ambulatory Visit: Payer: Self-pay | Admitting: Nurse Practitioner

## 2020-01-16 ENCOUNTER — Other Ambulatory Visit: Payer: Self-pay | Admitting: Nurse Practitioner

## 2020-05-19 ENCOUNTER — Other Ambulatory Visit: Payer: Self-pay

## 2020-05-19 DIAGNOSIS — J45909 Unspecified asthma, uncomplicated: Secondary | ICD-10-CM

## 2020-05-19 DIAGNOSIS — U071 COVID-19: Secondary | ICD-10-CM

## 2020-05-19 DIAGNOSIS — R0689 Other abnormalities of breathing: Secondary | ICD-10-CM

## 2020-05-19 MED ORDER — ALBUTEROL SULFATE (2.5 MG/3ML) 0.083% IN NEBU: 2.5000 mg | INHALATION_SOLUTION | Freq: Four times a day (QID) | RESPIRATORY_TRACT | 1 refills | Status: DC | PRN

## 2020-05-20 ENCOUNTER — Other Ambulatory Visit: Payer: Self-pay

## 2020-08-15 ENCOUNTER — Encounter: Payer: Self-pay | Admitting: Nurse Practitioner

## 2020-08-15 ENCOUNTER — Ambulatory Visit (INDEPENDENT_AMBULATORY_CARE_PROVIDER_SITE_OTHER): Payer: Commercial Managed Care - PPO | Admitting: Nurse Practitioner

## 2020-08-15 ENCOUNTER — Other Ambulatory Visit: Payer: Self-pay

## 2020-08-15 VITALS — BP 140/98 | HR 85 | Temp 97.2°F | Ht 73.0 in | Wt 266.2 lb

## 2020-08-15 DIAGNOSIS — E559 Vitamin D deficiency, unspecified: Secondary | ICD-10-CM

## 2020-08-15 DIAGNOSIS — J45901 Unspecified asthma with (acute) exacerbation: Secondary | ICD-10-CM | POA: Diagnosis not present

## 2020-08-15 DIAGNOSIS — R7303 Prediabetes: Secondary | ICD-10-CM | POA: Diagnosis not present

## 2020-08-15 DIAGNOSIS — D649 Anemia, unspecified: Secondary | ICD-10-CM

## 2020-08-15 MED ORDER — ALBUTEROL SULFATE HFA 108 (90 BASE) MCG/ACT IN AERS: 2.0000 | INHALATION_SPRAY | Freq: Four times a day (QID) | RESPIRATORY_TRACT | 3 refills | Status: DC | PRN

## 2020-08-15 MED ORDER — PREDNISONE 10 MG PO TABS
ORAL_TABLET | ORAL | 0 refills | Status: DC
Start: 2020-08-15 — End: 2020-09-12

## 2020-08-15 MED ORDER — FLUTICASONE-SALMETEROL 250-50 MCG/DOSE IN AEPB: 1.0000 | INHALATION_SPRAY | Freq: Two times a day (BID) | RESPIRATORY_TRACT | 3 refills | Status: DC

## 2020-08-15 NOTE — Progress Notes (Signed)
Subjective:    Patient ID: Eddie Glass, male    DOB: Oct 17, 1965, 55 y.o.   MRN: [id number]  HPI: Eddie Glass is a 55 y.o. male presenting for medication refill.  Chief Complaint  Patient presents with  . Medication Refill    Asthma med refill  . Hypertension    Wellness ck at work shows high bp readings   UPPER RESPIRATORY TRACT INFECTION Reports an upper respiratory infection where he was coughing for about 3 weeks.  This has been gone for about 10 days now but his breathing remains difficult.  He complains of not being able to take a deep breath and gets short of breath easily.  Did not take antibiotics, did not get tested for COVID.   Fever: no Cough: no Shortness of breath: yes Wheezing: no Chest pain: no Chest tightness: no Chest congestion: no Nasal congestion: no Runny nose: no Post nasal drip: no Sneezing: no Sore throat: no Swollen glands: no Sinus pressure: no Headache: yes Face pain: no Toothache: no Ear pain: no  Ear pressure: no  Eyes red/itching:no Eye drainage/crusting: no  Nausea: no  Vomiting: no Diarrhea: no  Change in appetite: no  Loss of taste/smell: no  Rash: no Fatigue: yes Sick contacts: no Strep contacts: no  Context: better; but not all of the way Recurrent sinusitis: no Treatments attempted: Dayquil/Nyquil Relief with OTC medications: somewhat  ASTHMA Has had COVID twice; never had trouble with breathing prior to that. Asthma status: exacerbated Satisfied with current treatment?: no Albuterol/rescue inhaler frequency: 4-6 times per day; helps for a short time Dyspnea frequency: multiple times per day Wheezing frequency: multiple per day Cough frequency: none Nocturnal symptom frequency:  yes Limitation of activity: none Current upper respiratory symptoms: no Triggers: upper respiratory virus; cats Last Spirometry:  Never done Failed/intolerant to following asthma meds:  Asthma meds in past:  Aerochamber/spacer use:  no Visits to ER or Urgent Care in past year: yes; once Pneumovax: Not up to Date Influenza: Not up to Date  Allergies  Allergen Reactions  . Other Anaphylaxis    Cats    Outpatient Encounter Medications as of 08/15/2020  Medication Sig  . albuterol (PROVENTIL) (2.5 MG/3ML) 0.083% nebulizer solution Take 3 mLs (2.5 mg total) by nebulization every 6 (six) hours as needed for wheezing or shortness of breath.  . ferrous sulfate 325 (65 FE) MG EC tablet Take 325 mg by mouth 3 (three) times daily with meals.  . montelukast (SINGULAIR) 10 MG tablet Take 1 tablet (10 mg total) by mouth at bedtime.  . vitamin B-12 (CYANOCOBALAMIN) 500 MCG tablet Take 500 mcg by mouth daily.  . [DISCONTINUED] albuterol (VENTOLIN HFA) 108 (90 Base) MCG/ACT inhaler INHALE ONE PUFF BY MOUTH EVERY 6 HOURS AS NEEDED FOR WHEEZING OR FOR SHORTNESS OF BREATH  . [DISCONTINUED] Fluticasone-Salmeterol (ADVAIR DISKUS) 250-50 MCG/DOSE AEPB Inhale 1 puff into the lungs 2 (two) times daily.  . [DISCONTINUED] predniSONE (DELTASONE) 10 MG tablet Day one: 6 tabs (60mg ) Day two: 5 tabs (50mg ) Day three: 4 tabs (40mg ) Day four: 3 tabs (30mg ) Day five: 2 tabs (20mg ) Day six: 1 tab (10mg )  . [DISCONTINUED] Vitamin D, Ergocalciferol, (DRISDOL) 1.25 MG (50000 UT) CAPS capsule Take 1 capsule (50,000 Units total) by mouth every 7 (seven) days. x12 weeks.  Marland Kitchen albuterol (VENTOLIN HFA) 108 (90 Base) MCG/ACT inhaler Inhale 2 puffs into the lungs every 6 (six) hours as needed for wheezing or shortness of breath.  . Fluticasone-Salmeterol (ADVAIR  DISKUS) 250-50 MCG/DOSE AEPB Inhale 1 puff into the lungs 2 (two) times daily.  . predniSONE (DELTASONE) 10 MG tablet Day one: 6 tabs (60mg ) Day two: 5 tabs (50mg ) Day three: 4 tabs (40mg ) Day four: 3 tabs (30mg ) Day five: 2 tabs (20mg ) Day six: 1 tab (10mg )   Facility-Administered Encounter Medications as of 08/15/2020  Medication  . ipratropium-albuterol (DUONEB) 0.5-2.5 (3) MG/3ML nebulizer solution 3 mL     Patient Active Problem List   Diagnosis Date Noted  . COVID-19 08/31/2019  . Low testosterone 01/23/2019  . Mild intermittent asthma without complication 85/88/5027  . 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 Vitamin D deficiency 01/06/2019  . Prediabetes 07/01/2018  . Anemia 07/01/2018  . Asthma 02/11/2018  . Allergy 02/11/2018    Past Medical History:  Diagnosis Date  . Allergy    seasonal  . Anemia   . Asthma   . COVID-19 08/31/2019  . Hyperlipidemia 07/01/2018    Relevant past medical, surgical, family and social history reviewed and updated as indicated. Interim medical history since our last visit reviewed.  Review of Systems  Per HPI unless specifically indicated above     Objective:    BP (!) 140/98   Pulse 85   Temp (!) 97.2 F (36.2 C)   Ht 6\' 1"  (1.854 m)   Wt 266 lb 3.2 oz (120.7 kg)   SpO2 98%   BMI 35.12 kg/m   Wt Readings from Last 3 Encounters:  08/15/20 266 lb 3.2 oz (120.7 kg)  09/09/19 278 lb (126.1 kg)  08/31/19 276 lb 6.4 oz (125.4 kg)    Physical Exam Vitals and nursing note reviewed.  Constitutional:      General: He is not in acute distress.    Appearance: Normal appearance. He is not toxic-appearing or diaphoretic.  HENT:     Head: Normocephalic and atraumatic.     Right Ear: External ear normal.     Left Ear: External ear normal.     Nose: Nose normal. No congestion.  Eyes:     General: No scleral icterus.    Extraocular Movements: Extraocular movements intact.  Cardiovascular:     Rate and Rhythm: Normal rate.     Heart sounds: Normal heart sounds. No murmur heard.   Pulmonary:     Effort: Pulmonary effort is normal.     Breath sounds: Decreased air movement present. Decreased breath sounds present.  Abdominal:     General: Abdomen is flat.     Palpations: Abdomen is soft.  Musculoskeletal:     Cervical back: Normal range of motion.  Skin:    General: Skin is  warm and dry.     Capillary Refill: Capillary refill takes less than 2 seconds.     Coloration: Skin is not jaundiced or pale.     Findings: No erythema.  Neurological:     Mental Status: He is alert and oriented to person, place, and time.  Psychiatric:        Mood and Affect: Mood normal.        Behavior: Behavior normal.        Thought Content: Thought content normal.        Judgment: Judgment normal.       Assessment & Plan:   Problem List Items Addressed This Visit      Respiratory   Asthma    Chronic, currently exacerbated from assumed upper respiratory  virus.  Will treat with prednisone taper and return to clinic in 2-3 weeks to monitor for improvement.  Continue maintenance medication with Singular and Advair.  May need to adjust maintenance medication if symptoms not improved.  May also consider chest x-ray if not improved.  Flu shot given today.      Relevant Medications   predniSONE (DELTASONE) 10 MG tablet   Fluticasone-Salmeterol (ADVAIR DISKUS) 250-50 MCG/DOSE AEPB   albuterol (VENTOLIN HFA) 108 (90 Base) MCG/ACT inhaler     Other   Anemia   Relevant Orders   CBC with Differential/Platelet   Prediabetes   Relevant Orders   Hemoglobin A1c   Lipid panel   COMPLETE METABOLIC PANEL WITH GFR   Vitamin D deficiency - Primary   Relevant Orders   VITAMIN D 25 Hydroxy (Vit-D Deficiency, Fractures)       Follow up plan: Return in about 3 weeks (around 09/05/2020) for CPE with labs 1 week prior fasting.

## 2020-08-15 NOTE — Patient Instructions (Signed)
http://www.aaaai.org/conditions-and-treatments/asthma">  Asthma, Adult  Asthma is a long-term (chronic) condition that causes recurrent episodes in which the airways become tight and narrow. The airways are the passages that lead from the nose and mouth down into the lungs. Asthma episodes, also called asthma attacks, can cause coughing, wheezing, shortness of breath, and chest pain. The airways can also fill with mucus. During an attack, it can be difficult to breathe. Asthma attacks can range from minor to life threatening. Asthma cannot be cured, but medicines and lifestyle changes can help control it and treat acute attacks. What are the causes? This condition is believed to be caused by inherited (genetic) and environmental factors, but its exact cause is not known. There are many things that can bring on an asthma attack or make asthma symptoms worse (triggers). Asthma triggers are different for each person. Common triggers include:  Mold.  Dust.  Cigarette smoke.  Cockroaches.  Things that can cause allergy symptoms (allergens), such as animal dander or pollen from trees or grass.  Air pollutants such as household cleaners, wood smoke, smog, or chemical odors.  Cold air, weather changes, and winds (which increase molds and pollen in the air).  Strong emotional expressions such as crying or laughing hard.  Stress.  Certain medicines (such as aspirin) or types of medicines (such as beta-blockers).  Sulfites in foods and drinks. Foods and drinks that may contain sulfites include dried fruit, potato chips, and sparkling grape juice.  Infections or inflammatory conditions such as the flu, a cold, or inflammation of the nasal membranes (rhinitis).  Gastroesophageal reflux disease (GERD).  Exercise or strenuous activity. What are the signs or symptoms? Symptoms of this condition may occur right after asthma is triggered or many hours later. Symptoms include:  Wheezing. This can  sound like whistling when you breathe.  Excessive nighttime or early morning coughing.  Frequent or severe coughing with a common cold.  Chest tightness.  Shortness of breath.  Tiredness (fatigue) with minimal activity. How is this diagnosed? This condition is diagnosed based on:  Your medical history.  A physical exam.  Tests, which may include: ? Lung function studies and pulmonary studies (spirometry). These tests can evaluate the flow of air in your lungs. ? Allergy tests. ? Imaging tests, such as X-rays. How is this treated? There is no cure for this condition, but treatment can help control your symptoms. Treatment for asthma usually involves:  Identifying and avoiding your asthma triggers.  Using medicines to control your symptoms. Generally, two types of medicines are used to treat asthma: ? Controller medicines. These help prevent asthma symptoms from occurring. They are usually taken every day. ? Fast-acting reliever or rescue medicines. These quickly relieve asthma symptoms by widening the narrow and tight airways. They are used as needed and provide short-term relief.  Using supplemental oxygen. This may be needed during a severe episode.  Using other medicines, such as: ? Allergy medicines, such as antihistamines, if your asthma attacks are triggered by allergens. ? Immune medicines (immunomodulators). These are medicines that help control the immune system.  Creating an asthma action plan. An asthma action plan is a written plan for managing and treating your asthma attacks. This plan includes: ? A list of your asthma triggers and how to avoid them. ? Information about when medicines should be taken and when their dosage should be changed. ? Instructions about using a device called a peak flow meter. A peak flow meter measures how well the lungs are working   and the severity of your asthma. It helps you monitor your condition. Follow these instructions at  home: Controlling your home environment Control your home environment in the following ways to help avoid triggers and prevent asthma attacks:  Change your heating and air conditioning filter regularly.  Limit your use of fireplaces and wood stoves.  Get rid of pests (such as roaches and mice) and their droppings.  Throw away plants if you see mold on them.  Clean floors and dust surfaces regularly. Use unscented cleaning products.  Try to have someone else vacuum for you regularly. Stay out of rooms while they are being vacuumed and for a short while afterward. If you vacuum, use a dust mask from a hardware store, a double-layered or microfilter vacuum cleaner bag, or a vacuum cleaner with a HEPA filter.  Replace carpet with wood, tile, or vinyl flooring. Carpet can trap dander and dust.  Use allergy-proof pillows, mattress covers, and box spring covers.  Keep your bedroom a trigger-free room.  Avoid pets and keep windows closed when allergens are in the air.  Wash beddings every week in hot water and dry them in a dryer.  Use blankets that are made of polyester or cotton.  Clean bathrooms and kitchens with bleach. If possible, have someone repaint the walls in these rooms with mold-resistant paint. Stay out of the rooms that are being cleaned and painted.  Wash your hands often with soap and water. If soap and water are not available, use hand sanitizer.  Do not allow anyone to smoke in your home. General instructions  Take over-the-counter and prescription medicines only as told by your health care provider. ? Speak with your health care provider if you have questions about how or when to take the medicines. ? Make note if you are requiring more frequent dosages.  Do not use any products that contain nicotine or tobacco, such as cigarettes and e-cigarettes. If you need help quitting, ask your health care provider. Also, avoid being exposed to secondhand smoke.  Use a peak  flow meter as told by your health care provider. Record and keep track of the readings.  Understand and use the asthma action plan to help minimize, or stop an asthma attack, without needing to seek medical care.  Make sure you stay up to date on your yearly vaccinations as told by your health care provider. This may include vaccines for the flu and pneumonia.  Avoid outdoor activities when allergen counts are high and when air quality is low.  Wear a ski mask that covers your nose and mouth during outdoor winter activities. Exercise indoors on cold days if you can.  Warm up before exercising, and take time for a cool-down period after exercise.  Keep all follow-up visits as told by your health care provider. This is important. Where to find more information  For information about asthma, turn to the Centers for Disease Control and Prevention at http://www.clark.net/  For air quality information, turn to [employer] at https://www.miller-reyes.info/ Contact a health care provider if:  You have wheezing, shortness of breath, or a cough even while you are taking medicine to prevent attacks.  The mucus you cough up (sputum) is thicker than usual.  Your sputum changes from clear or white to yellow, green, gray, or bloody.  Your medicines are causing side effects, such as a rash, itching, swelling, or trouble breathing.  You need to use a reliever medicine more than 2-3 times a week.  Your peak  flow reading is still at 50-79% of your personal best after following your action plan for 1 hour.  You have a fever. Get help right away if:  You are getting worse and do not respond to treatment during an asthma attack.  You are short of breath when at rest or when doing very little physical activity.  You have difficulty eating, drinking, or talking.  You have chest pain or tightness.  You develop a fast heartbeat or palpitations.  You have a bluish color to your lips or fingernails.  You are  light-headed or dizzy, or you faint.  Your peak flow reading is less than 50% of your personal best.  You feel too tired to breathe normally. Summary  Asthma is a long-term (chronic) condition that causes recurrent episodes in which the airways become tight and narrow. These episodes can cause coughing, wheezing, shortness of breath, and chest pain.  Asthma cannot be cured, but medicines and lifestyle changes can help control it and treat acute attacks.  Make sure you understand how to avoid triggers and how and when to use your medicines.  Asthma attacks can range from minor to life threatening. Get help right away if you have an asthma attack and do not respond to treatment with your usual rescue medicines. This information is not intended to replace advice given to you by your health care provider. Make sure you discuss any questions you have with your health care provider. Document Revised: 04/01/2020 Document Reviewed: 11/04/2019 Elsevier Patient Education  2021 [employer].

## 2020-08-15 NOTE — Assessment & Plan Note (Addendum)
Chronic, currently exacerbated from assumed upper respiratory virus.  Will treat with prednisone taper and return to clinic in 2-3 weeks to monitor for improvement.  Continue maintenance medication with Singular and Advair.  May need to adjust maintenance medication if symptoms not improved.  May also consider chest x-ray if not improved.  Flu shot given today.

## 2020-09-05 ENCOUNTER — Other Ambulatory Visit: Payer: Commercial Managed Care - PPO

## 2020-09-05 ENCOUNTER — Other Ambulatory Visit: Payer: Self-pay

## 2020-09-05 DIAGNOSIS — D649 Anemia, unspecified: Secondary | ICD-10-CM

## 2020-09-05 DIAGNOSIS — R7303 Prediabetes: Secondary | ICD-10-CM

## 2020-09-05 DIAGNOSIS — E559 Vitamin D deficiency, unspecified: Secondary | ICD-10-CM

## 2020-09-06 LAB — COMPLETE METABOLIC PANEL WITH GFR
AG Ratio: 1.3 (calc) (ref 1.0–2.5)
ALT: 12 U/L (ref 9–46)
AST: 19 U/L (ref 10–35)
Albumin: 4 g/dL (ref 3.6–5.1)
Alkaline phosphatase (APISO): 67 U/L (ref 35–144)
BUN: 15 mg/dL (ref 7–25)
CO2: 28 mmol/L (ref 20–32)
Calcium: 9.2 mg/dL (ref 8.6–10.3)
Chloride: 105 mmol/L (ref 98–110)
Creat: 1.23 mg/dL (ref 0.70–1.33)
GFR, Est African American: 77 mL/min/{1.73_m2} (ref >=60)
GFR, Est Non African American: 66 mL/min/{1.73_m2} (ref >=60)
Globulin: 3.1 g/dL (calc) (ref 1.9–3.7)
Glucose, Bld: 107 mg/dL — ABNORMAL HIGH (ref 65–99)
Potassium: 4.4 mmol/L (ref 3.5–5.3)
Sodium: 140 mmol/L (ref 135–146)
Total Bilirubin: 0.4 mg/dL (ref 0.2–1.2)
Total Protein: 7.1 g/dL (ref 6.1–8.1)

## 2020-09-06 LAB — LIPID PANEL
Cholesterol: 157 mg/dL (ref <200)
HDL: 47 mg/dL (ref >=40)
LDL Cholesterol (Calc): 93 mg/dL (calc)
Non-HDL Cholesterol (Calc): 110 mg/dL (calc) (ref <130)
Total CHOL/HDL Ratio: 3.3 (calc) (ref <5.0)
Triglycerides: 83 mg/dL (ref <150)

## 2020-09-06 LAB — CBC WITH DIFFERENTIAL/PLATELET
Absolute Monocytes: 537 cells/uL (ref 200–950)
Basophils Absolute: 44 cells/uL (ref 0–200)
Basophils Relative: 0.5 %
Eosinophils Absolute: 211 cells/uL (ref 15–500)
Eosinophils Relative: 2.4 %
HCT: 40.4 % (ref 38.5–50.0)
Hemoglobin: 13.4 g/dL (ref 13.2–17.1)
Lymphs Abs: 1971 cells/uL (ref 850–3900)
MCH: 25.7 pg — ABNORMAL LOW (ref 27.0–33.0)
MCHC: 33.2 g/dL (ref 32.0–36.0)
MCV: 77.5 fL — ABNORMAL LOW (ref 80.0–100.0)
MPV: 9 fL (ref 7.5–12.5)
Monocytes Relative: 6.1 %
Neutro Abs: 6037 cells/uL (ref 1500–7800)
Neutrophils Relative %: 68.6 %
Platelets: 364 10*3/uL (ref 140–400)
RBC: 5.21 10*6/uL (ref 4.20–5.80)
RDW: 15.6 % — ABNORMAL HIGH (ref 11.0–15.0)
Total Lymphocyte: 22.4 %
WBC: 8.8 10*3/uL (ref 3.8–10.8)

## 2020-09-06 LAB — HEMOGLOBIN A1C
Hgb A1c MFr Bld: 6.6 % of total Hgb — ABNORMAL HIGH (ref <5.7)
Mean Plasma Glucose: 143 mg/dL
eAG (mmol/L): 7.9 mmol/L

## 2020-09-06 LAB — VITAMIN D 25 HYDROXY (VIT D DEFICIENCY, FRACTURES): Vit D, 25-Hydroxy: 32 ng/mL (ref 30–100)

## 2020-09-12 ENCOUNTER — Ambulatory Visit (INDEPENDENT_AMBULATORY_CARE_PROVIDER_SITE_OTHER): Payer: Commercial Managed Care - PPO | Admitting: Nurse Practitioner

## 2020-09-12 ENCOUNTER — Other Ambulatory Visit: Payer: Self-pay

## 2020-09-12 ENCOUNTER — Encounter: Payer: Commercial Managed Care - PPO | Admitting: Nurse Practitioner

## 2020-09-12 ENCOUNTER — Encounter: Payer: Self-pay | Admitting: Nurse Practitioner

## 2020-09-12 VITALS — BP 134/86 | HR 81 | Temp 97.3°F | Ht 73.0 in | Wt 264.8 lb

## 2020-09-12 DIAGNOSIS — J45901 Unspecified asthma with (acute) exacerbation: Secondary | ICD-10-CM | POA: Diagnosis not present

## 2020-09-12 DIAGNOSIS — Z6834 Body mass index (BMI) 34.0-34.9, adult: Secondary | ICD-10-CM

## 2020-09-12 DIAGNOSIS — E1165 Type 2 diabetes mellitus with hyperglycemia: Secondary | ICD-10-CM

## 2020-09-12 DIAGNOSIS — R718 Other abnormality of red blood cells: Secondary | ICD-10-CM

## 2020-09-12 DIAGNOSIS — E559 Vitamin D deficiency, unspecified: Secondary | ICD-10-CM | POA: Diagnosis not present

## 2020-09-12 DIAGNOSIS — Z0000 Encounter for general adult medical examination without abnormal findings: Secondary | ICD-10-CM

## 2020-09-12 DIAGNOSIS — J45909 Unspecified asthma, uncomplicated: Secondary | ICD-10-CM | POA: Diagnosis not present

## 2020-09-12 DIAGNOSIS — Z0001 Encounter for general adult medical examination with abnormal findings: Secondary | ICD-10-CM | POA: Diagnosis not present

## 2020-09-12 DIAGNOSIS — D509 Iron deficiency anemia, unspecified: Secondary | ICD-10-CM

## 2020-09-12 MED ORDER — FLUTICASONE-SALMETEROL 500-50 MCG/DOSE IN AEPB: 1.0000 | INHALATION_SPRAY | Freq: Two times a day (BID) | RESPIRATORY_TRACT | 3 refills | Status: AC

## 2020-09-12 MED ORDER — ALBUTEROL SULFATE HFA 108 (90 BASE) MCG/ACT IN AERS: 2.0000 | INHALATION_SPRAY | Freq: Four times a day (QID) | RESPIRATORY_TRACT | 3 refills | Status: DC | PRN

## 2020-09-12 MED ORDER — VITAMIN D 50 MCG (2000 UT) PO TABS: 2000.0000 [IU] | ORAL_TABLET | Freq: Every day | ORAL | 0 refills | Status: AC

## 2020-09-12 MED ORDER — PREDNISONE 20 MG PO TABS: 40.0000 mg | ORAL_TABLET | Freq: Every day | ORAL | 0 refills | Status: DC

## 2020-09-12 MED ORDER — FERROUS SULFATE 325 (65 FE) MG PO TBEC: 325.0000 mg | DELAYED_RELEASE_TABLET | Freq: Every day | ORAL | 0 refills | Status: AC

## 2020-09-12 NOTE — Progress Notes (Signed)
BP 134/86   Pulse 81   Temp (!) 97.3 F (36.3 C)   Ht 6\' 1"  (1.854 m)   Wt 264 lb 12.8 oz (120.1 kg)   SpO2 96%   BMI 34.94 kg/m    Subjective:    Patient ID: Eddie Glass, male    DOB: 02/07/1966, 55 y.o.   MRN: [id number]  HPI: Eddie Glass is a 55 y.o. male presenting on 09/12/2020 for comprehensive medical examination. Current medical complaints include:  ASTHMA Patient reports that his breathing is "somewhat," better.  He is only had to use nebulizer a couple of times since his last visit here.  He is using his albuterol inhaler multiple times per day, however. Asthma status: uncontrolled Satisfied with current treatment?: no Albuterol/rescue inhaler frequency: multiple times daily Dyspnea frequency: multiple times daily; with activity Wheezing frequency: rare Cough frequency: rare Nocturnal symptom frequency:  sometimes Limitation of activity: yes Current upper respiratory symptoms: no Triggers: activity Last Spirometry: none Failed/intolerant to following asthma meds:  Asthma meds in past: none Aerochamber/spacer use: no Visits to ER or Urgent Care in past year: yes; once Pneumovax: Not up to Date Influenza: Up to Date  Interim Problems from his last visit: no  Depression Screen done today and results listed below:  Depression screen [facility] 2/9 08/31/2019 02/11/2018  Decreased Interest 0 0  Down, Depressed, Hopeless 0 0  PHQ - 2 Score 0 0  Altered sleeping 0 -  Tired, decreased energy 0 -  Change in appetite 0 -  Feeling bad or failure about yourself  0 -  Trouble concentrating 0 -  Moving slowly or fidgety/restless 0 -  Suicidal thoughts 0 -  PHQ-9 Score 0 -  Difficult doing work/chores Not difficult at all -   The patient does not have a history of falls. I did not complete a risk assessment for falls. A plan of care for falls was not documented.  Past Medical History:  Past Medical History:  Diagnosis Date  . Allergy    seasonal  . Anemia   .  Asthma   . COVID-19 08/31/2019  . Hyperlipidemia 07/01/2018    Surgical History:  History reviewed. No pertinent surgical history.  Medications:  Current Outpatient Medications on File Prior to Visit  Medication Sig  . albuterol (PROVENTIL) (2.5 MG/3ML) 0.083% nebulizer solution Take 3 mLs (2.5 mg total) by nebulization every 6 (six) hours as needed for wheezing or shortness of breath.  . montelukast (SINGULAIR) 10 MG tablet Take 1 tablet (10 mg total) by mouth at bedtime.  . vitamin B-12 (CYANOCOBALAMIN) 500 MCG tablet Take 500 mcg by mouth daily.   Current Facility-Administered Medications on File Prior to Visit  Medication  . ipratropium-albuterol (DUONEB) 0.5-2.5 (3) MG/3ML nebulizer solution 3 mL    Allergies:  Allergies  Allergen Reactions  . Other Anaphylaxis    Cats    Social History:  Social History   Socioeconomic History  . Marital status: Single    Spouse name: Not on file  . Number of children: 1  . Years of education: Not on file  . Highest education level: Not on file  Occupational History  . Not on file  Tobacco Use  . Smoking status: Never Smoker  . Smokeless tobacco: Never Used  Vaping Use  . Vaping Use: Never used  Substance and Sexual Activity  . Alcohol use: Yes    Alcohol/week: 5.0 standard drinks    Types: 5 Standard drinks or equivalent per  week    Comment: 0-3 times a week, social, 2-3 drinks of everything, burbon club  . Drug use: Never  . Sexual activity: Not Currently  Other Topics Concern  . Not on file  Social History Narrative  . Not on file   Social Determinants of Health   Financial Resource Strain: Not on file  Food Insecurity: Not on file  Transportation Needs: Not on file  Physical Activity: Not on file  Stress: Not on file  Social Connections: Not on file  Intimate Partner Violence: Not on file   Social History   Tobacco Use  Smoking Status Never Smoker  Smokeless Tobacco Never Used   Social History    Substance and Sexual Activity  Alcohol Use Yes  . Alcohol/week: 5.0 standard drinks  . Types: 5 Standard drinks or equivalent per week   Comment: 0-3 times a week, social, 2-3 drinks of everything, burbon club    Family History:  Family History  Problem Relation Age of Onset  . Diabetes Mother   . Cancer Father        lung cancer  . Colon cancer Neg Hx   . Esophageal cancer Neg Hx   . Rectal cancer Neg Hx   . Stomach cancer Neg Hx     Past medical history, surgical history, medications, allergies, family history and social history reviewed with patient today and changes made to appropriate areas of the chart.   Review of Systems  Constitutional: Negative.  Negative for chills, fever, malaise/fatigue and weight loss.  HENT: Negative.   Eyes: Negative.   Respiratory: Positive for shortness of breath. Negative for cough, sputum production and wheezing.   Cardiovascular: Negative.  Negative for chest pain and palpitations.  Gastrointestinal: Negative.   Genitourinary: Negative.   Musculoskeletal: Negative.   Skin: Negative.  Negative for itching and rash.  Neurological: Negative.   Psychiatric/Behavioral: Negative.        Objective:    BP 134/86   Pulse 81   Temp (!) 97.3 F (36.3 C)   Ht 6\' 1"  (1.854 m)   Wt 264 lb 12.8 oz (120.1 kg)   SpO2 96%   BMI 34.94 kg/m   Wt Readings from Last 3 Encounters:  09/12/20 264 lb 12.8 oz (120.1 kg)  08/15/20 266 lb 3.2 oz (120.7 kg)  09/09/19 278 lb (126.1 kg)    Physical Exam Constitutional:      Appearance: Normal appearance. He is normal weight.  HENT:     Head: Normocephalic and atraumatic.     Right Ear: Tympanic membrane, ear canal and external ear normal.     Left Ear: Tympanic membrane, ear canal and external ear normal.     Nose: Nose normal. No congestion.     Mouth/Throat:     Mouth: Mucous membranes are moist.     Pharynx: Oropharynx is clear. No oropharyngeal exudate or posterior oropharyngeal erythema.   Eyes:     General: No scleral icterus.       Right eye: No discharge.        Left eye: No discharge.     Extraocular Movements: Extraocular movements intact.     Pupils: Pupils are equal, round, and reactive to light.  Neck:     Vascular: No carotid bruit.  Cardiovascular:     Rate and Rhythm: Normal rate and regular rhythm.     Heart sounds: Normal heart sounds. No murmur heard. No gallop.   Pulmonary:     Effort: Pulmonary effort  is normal. No respiratory distress.     Breath sounds: Decreased air movement present. No wheezing or rhonchi.  Abdominal:     General: Abdomen is flat. Bowel sounds are normal. There is no distension.     Palpations: Abdomen is soft.     Tenderness: There is no abdominal tenderness.  Genitourinary:    Comments: Deferred using shared decision-making Musculoskeletal:        General: No swelling or tenderness. Normal range of motion.     Cervical back: Normal range of motion and neck supple. No tenderness.     Right lower leg: No edema.     Left lower leg: No edema.  Lymphadenopathy:     Cervical: No cervical adenopathy.  Skin:    General: Skin is warm and dry.     Coloration: Skin is not jaundiced.     Findings: No lesion.  Neurological:     General: No focal deficit present.     Mental Status: He is alert and oriented to person, place, and time. Mental status is at baseline.     Sensory: No sensory deficit.     Motor: No weakness.     Gait: Gait normal.  Psychiatric:        Mood and Affect: Mood normal.        Behavior: Behavior normal.        Thought Content: Thought content normal.        Judgment: Judgment normal.     Results for orders placed or performed in visit on 09/05/20  VITAMIN D 25 Hydroxy (Vit-D Deficiency, Fractures)  Result Value Ref Range   Vit D, 25-Hydroxy 32 30 - 100 ng/mL  CBC with Differential/Platelet  Result Value Ref Range   WBC 8.8 3.8 - 10.8 Thousand/uL   RBC 5.21 4.20 - 5.80 Million/uL   Hemoglobin 13.4  13.2 - 17.1 g/dL   HCT 40.4 38.5 - 50.0 %   MCV 77.5 (L) 80.0 - 100.0 fL   MCH 25.7 (L) 27.0 - 33.0 pg   MCHC 33.2 32.0 - 36.0 g/dL   RDW 15.6 (H) 11.0 - 15.0 %   Platelets 364 140 - 400 Thousand/uL   MPV 9.0 7.5 - 12.5 fL   Neutro Abs 6,037 1,500 - 7,800 cells/uL   Lymphs Abs 1,971 850 - 3,900 cells/uL   Absolute Monocytes 537 200 - 950 cells/uL   Eosinophils Absolute 211 15 - 500 cells/uL   Basophils Absolute 44 0 - 200 cells/uL   Neutrophils Relative % 68.6 %   Total Lymphocyte 22.4 %   Monocytes Relative 6.1 %   Eosinophils Relative 2.4 %   Basophils Relative 0.5 %  COMPLETE METABOLIC PANEL WITH GFR  Result Value Ref Range   Glucose, Bld 107 (H) 65 - 99 mg/dL   BUN 15 7 - 25 mg/dL   Creat 1.23 0.70 - 1.33 mg/dL   GFR, Est Non African American 66 > OR = 60 mL/min/1.78m2   GFR, Est African American 77 > OR = 60 mL/min/1.28m2   BUN/Creatinine Ratio NOT APPLICABLE 6 - 22 (calc)   Sodium 140 135 - 146 mmol/L   Potassium 4.4 3.5 - 5.3 mmol/L   Chloride 105 98 - 110 mmol/L   CO2 28 20 - 32 mmol/L   Calcium 9.2 8.6 - 10.3 mg/dL   Total Protein 7.1 6.1 - 8.1 g/dL   Albumin 4.0 3.6 - 5.1 g/dL   Globulin 3.1 1.9 - 3.7 g/dL (calc)   AG Ratio  1.3 1.0 - 2.5 (calc)   Total Bilirubin 0.4 0.2 - 1.2 mg/dL   Alkaline phosphatase (APISO) 67 35 - 144 U/L   AST 19 10 - 35 U/L   ALT 12 9 - 46 U/L  Lipid panel  Result Value Ref Range   Cholesterol 157 <200 mg/dL   HDL 47 > OR = 40 mg/dL   Triglycerides 83 <150 mg/dL   LDL Cholesterol (Calc) 93 mg/dL (calc)   Total CHOL/HDL Ratio 3.3 <5.0 (calc)   Non-HDL Cholesterol (Calc) 110 <130 mg/dL (calc)  Hemoglobin A1c  Result Value Ref Range   Hgb A1c MFr Bld 6.6 (H) <5.7 % of total Hgb   Mean Plasma Glucose 143 mg/dL   eAG (mmol/L) 7.9 mmol/L      Assessment & Plan:   Problem List Items Addressed This Visit      Respiratory   Asthma    Chronic, improving.  Will increase Advair to 500 mcg and give prednisone burst.  If symptoms do not  continue improving, will place referral to pulmonology for further work-up and treatment.  At that time, may consider further imaging with CT scan of chest.      Relevant Medications   albuterol (VENTOLIN HFA) 108 (90 Base) MCG/ACT inhaler   predniSONE (DELTASONE) 20 MG tablet   Fluticasone-Salmeterol (ADVAIR DISKUS) 500-50 MCG/DOSE AEPB     Endocrine   Type 2 diabetes mellitus with hyperglycemia (HCC)    A1c 6.6% 09/05/2020.  Discussed with patient today-this is in the diabetes range and recommended treatment with Metformin to help lower blood sugar.  Patient declines pharmacologic treatment at this time and wishes to closely watch carbohydrates and simple sugars.  Will reassess A1c in 3 months.  We will also perform foot exam, eye exam, urine microalbumin, and consider statin.      Relevant Orders   Hemoglobin A1c     Other   Anemia   Relevant Medications   ferrous sulfate 325 (65 FE) MG EC tablet   Other Relevant Orders   Hemoglobin A1c   BMI 34.0-34.9,adult    Discussed dietary lifestyle changes.  Goal of physical activity is 30 minutes 5 times weekly.      Low mean corpuscular volume (MCV)    Chronic, ongoing.  Patient borderline anemic today as well.  With elevated RDW, likely iron deficiency.  Reports a history of this as well.  Colonoscopy in 2019 showed 2 small polyps, however they were not bleeding.  Encouraged to start taking daily or every other day iron supplement with a very small glass of orange juice to help with absorption.  We will recheck CBC in 3 months.  Patient to notify us in the meantime if he is unable to tolerate oral iron.      Relevant Medications   ferrous sulfate 325 (65 FE) MG EC tablet   Other Relevant Orders   CBC with Differential   Vitamin D deficiency    Chronic, improving.  Vitamin D on 09/05/2020 was 32.  Continue supplementation for now.      Relevant Medications   Cholecalciferol (VITAMIN D) 50 MCG (2000 UT) tablet   Other Relevant  Orders   Vitamin D, 25-hydroxy    Other Visit Diagnoses    Annual physical exam    -  Primary      Discussed aspirin prophylaxis for myocardial infarction prevention and decision was it was not indicated  LABORATORY TESTING:  Health maintenance labs ordered today as discussed above.  The natural history of prostate cancer and ongoing controversy regarding screening and potential treatment outcomes of prostate cancer has been discussed with the patient. The meaning of a false positive PSA and a false negative PSA has been discussed. He indicates understanding of the limitations of this screening test and wishes not to proceed with screening PSA testing.   IMMUNIZATIONS:   - Tdap: Tetanus vaccination status reviewed: last tetanus booster within 10 years. - Influenza: Up to date - Pneumovax: Refused - Prevnar: Refused - HPV: Not applicable - Zostavax vaccine: Not applicable - [id number] vaccine: last dose of Moderna vaccine 07/27/2019  SCREENING: - Colonoscopy: Up to date  Discussed with patient purpose of the colonoscopy is to detect colon cancer at curable precancerous or early stages   - AAA Screening: Not applicable  -Hearing Test: Not applicable  -Spirometry: Not applicable   PATIENT COUNSELING:    Sexuality: Discussed sexually transmitted diseases, partner selection, use of condoms, avoidance of unintended pregnancy  and contraceptive alternatives.   Advised to avoid cigarette smoking.  I discussed with the patient that most people either abstain from alcohol or drink within safe limits (<=14/week and <=4 drinks/occasion for males, <=7/weeks and <= 3 drinks/occasion for females) and that the risk for alcohol disorders and other health effects rises proportionally with the number of drinks per week and how often a drinker exceeds daily limits.  Discussed cessation/primary prevention of drug use and availability of treatment for abuse.   Diet: Encouraged to adjust caloric  intake to maintain  or achieve ideal body weight, to reduce intake of dietary saturated fat and total fat, to limit sodium intake by avoiding high sodium foods and not adding table salt, and to maintain adequate dietary potassium and calcium preferably from fresh fruits, vegetables, and low-fat dairy products.    stressed the importance of regular exercise  Injury prevention: Discussed safety belts, safety helmets, smoke detector, smoking near bedding or upholstery.   Dental health: Discussed importance of regular tooth brushing, flossing, and dental visits.   Follow up plan: NEXT PREVENTATIVE PHYSICAL DUE IN 1 YEAR. Return in about 3 months (around 12/10/2020) for DM, Anemia, Vitamin D deficiency.

## 2020-09-12 NOTE — Patient Instructions (Addendum)
F/u 3 months; blood 1 week prior    Iron-Rich Diet  Iron is a mineral that helps your body to produce hemoglobin. Hemoglobin is a protein in red blood cells that carries oxygen to your body's tissues. Eating too little iron may cause you to feel weak and tired, and it can increase your risk of infection. Iron is naturally found in many foods, and many foods have iron added to them (iron-fortified foods). You may need to follow an iron-rich diet if you do not have enough iron in your body due to certain medical conditions. The amount of iron that you need each day depends on your age, your sex, and any medical conditions you have. Follow instructions from your health care provider or a diet and nutrition specialist (dietitian) about how much iron you should eat each day. What are tips for following this plan? Reading food labels  Check food labels to see how many milligrams (mg) of iron are in each serving. Cooking  Cook foods in pots and pans that are made from iron.  Take these steps to make it easier for your body to absorb iron from certain foods: ? Soak beans overnight before cooking. ? Soak whole grains overnight and drain them before using. ? Ferment flours before baking, such as by using yeast in bread dough. Meal planning  When you eat foods that contain iron, you should eat them with foods that are high in vitamin C. These include oranges, peppers, tomatoes, potatoes, and mango. Vitamin C helps your body to absorb iron. General information  Take iron supplements only as told by your health care provider. An overdose of iron can be life-threatening. If you were prescribed iron supplements, take them with orange juice or a vitamin C supplement.  When you eat iron-fortified foods or take an iron supplement, you should also eat foods that naturally contain iron, such as meat, poultry, and fish. Eating naturally iron-rich foods helps your body to absorb the iron that is added to other  foods or contained in a supplement.  Certain foods and drinks prevent your body from absorbing iron properly. Avoid eating these foods in the same meal as iron-rich foods or with iron supplements. These foods include: ? Coffee, black tea, and red wine. ? Milk, dairy products, and foods that are high in calcium. ? Beans and soybeans. ? Whole grains. What foods should I eat? Fruits Prunes. Raisins. Eat fruits high in vitamin C, such as oranges, grapefruits, and strawberries, alongside iron-rich foods. Vegetables Spinach (cooked). Green peas. Broccoli. Fermented vegetables. Eat vegetables high in vitamin C, such as leafy greens, potatoes, bell peppers, and tomatoes, alongside iron-rich foods. Grains Iron-fortified breakfast cereal. Iron-fortified whole-wheat bread. Enriched rice. Sprouted grains. Meats and other proteins Beef liver. Oysters. Beef. Shrimp. [place]. Chicken. [place]. Sardines. Chickpeas. Nuts. Tofu. Pumpkin seeds. Beverages Tomato juice. Fresh orange juice. Prune juice. Hibiscus tea. Fortified instant breakfast shakes. Sweets and desserts Blackstrap molasses. Seasonings and condiments Tahini. Fermented soy sauce. Other foods Wheat germ. The items listed above may not be a complete list of recommended foods and beverages. Contact a dietitian for more information. What foods should I avoid? Grains Whole grains. Bran cereal. Bran flour. Oats. Meats and other proteins Soybeans. Products made from soy protein. Black beans. Lentils. Mung beans. Split peas. Dairy Milk. Cream. Cheese. Yogurt. Cottage cheese. Beverages Coffee. Black tea. Red wine. Sweets and desserts Cocoa. Chocolate. Ice cream. Other foods Basil. Oregano. Large amounts of parsley. The items listed above may not  be a complete list of foods and beverages to avoid. Contact a dietitian for more information. Summary  Iron is a mineral that helps your body to produce hemoglobin. Hemoglobin is a protein in red  blood cells that carries oxygen to your body's tissues.  Iron is naturally found in many foods, and many foods have iron added to them (iron-fortified foods).  When you eat foods that contain iron, you should eat them with foods that are high in vitamin C. Vitamin C helps your body to absorb iron.  Certain foods and drinks prevent your body from absorbing iron properly, such as whole grains and dairy products. You should avoid eating these foods in the same meal as iron-rich foods or with iron supplements. This information is not intended to replace advice given to you by your health care provider. Make sure you discuss any questions you have with your health care provider. Document Revised: 06/14/2017 Document Reviewed: 05/28/2017 Elsevier Patient Education  2021 [employer].

## 2020-09-12 NOTE — Assessment & Plan Note (Addendum)
Chronic, improving.  Will increase Advair to 500 mcg and give prednisone burst.  If symptoms do not continue improving, will place referral to pulmonology for further work-up and treatment.  At that time, may consider further imaging with CT scan of chest.

## 2020-09-12 NOTE — Assessment & Plan Note (Signed)
A1c 6.6% 09/05/2020.  Discussed with patient today-this is in the diabetes range and recommended treatment with Metformin to help lower blood sugar.  Patient declines pharmacologic treatment at this time and wishes to closely watch carbohydrates and simple sugars.  Will reassess A1c in 3 months.  We will also perform foot exam, eye exam, urine microalbumin, and consider statin.

## 2020-09-12 NOTE — Assessment & Plan Note (Signed)
Chronic, improving.  Vitamin D on 09/05/2020 was 32.  Continue supplementation for now.

## 2020-09-12 NOTE — Assessment & Plan Note (Signed)
Chronic, ongoing.  Patient borderline anemic today as well.  With elevated RDW, likely iron deficiency.  Reports a history of this as well.  Colonoscopy in 2019 showed 2 small polyps, however they were not bleeding.  Encouraged to start taking daily or every other day iron supplement with a very small glass of orange juice to help with absorption.  We will recheck CBC in 3 months.  Patient to notify us in the meantime if he is unable to tolerate oral iron.

## 2020-09-12 NOTE — Assessment & Plan Note (Signed)
Discussed dietary lifestyle changes.  Goal of physical activity is 30 minutes 5 times weekly.

## 2020-10-13 ENCOUNTER — Encounter: Payer: Self-pay | Admitting: Family Medicine

## 2020-10-13 ENCOUNTER — Other Ambulatory Visit: Payer: Self-pay

## 2020-10-13 ENCOUNTER — Ambulatory Visit (INDEPENDENT_AMBULATORY_CARE_PROVIDER_SITE_OTHER): Payer: Commercial Managed Care - PPO | Admitting: Family Medicine

## 2020-10-13 ENCOUNTER — Ambulatory Visit (INDEPENDENT_AMBULATORY_CARE_PROVIDER_SITE_OTHER): Payer: Commercial Managed Care - PPO

## 2020-10-13 VITALS — BP 135/83 | HR 90 | Temp 97.5°F | Ht 74.02 in | Wt 272.4 lb

## 2020-10-13 DIAGNOSIS — R062 Wheezing: Secondary | ICD-10-CM

## 2020-10-13 DIAGNOSIS — R0600 Dyspnea, unspecified: Secondary | ICD-10-CM

## 2020-10-13 DIAGNOSIS — J4541 Moderate persistent asthma with (acute) exacerbation: Secondary | ICD-10-CM

## 2020-10-13 DIAGNOSIS — R635 Abnormal weight gain: Secondary | ICD-10-CM | POA: Insufficient documentation

## 2020-10-13 DIAGNOSIS — E1165 Type 2 diabetes mellitus with hyperglycemia: Secondary | ICD-10-CM | POA: Diagnosis not present

## 2020-10-13 MED ORDER — AZITHROMYCIN 250 MG PO TABS: ORAL_TABLET | ORAL | 0 refills | Status: DC

## 2020-10-13 MED ORDER — PREDNISONE 10 MG (48) PO TBPK: ORAL_TABLET | ORAL | 0 refills | Status: DC

## 2020-10-13 MED ORDER — PHENTERMINE HCL 37.5 MG PO CAPS
37.5000 mg | ORAL_CAPSULE | ORAL | 2 refills | Status: DC
Start: 2020-10-13 — End: 2021-09-21

## 2020-10-13 NOTE — Assessment & Plan Note (Signed)
Options for weight management discussed.  Will start phentermine 37.5mg  daily.  Discussed working on dietary and exercise changes to maximize weight loss.  F/u in about 6 weeks.

## 2020-10-13 NOTE — Assessment & Plan Note (Signed)
Dietary changes discussed.  Update a1c in 3 months.

## 2020-10-13 NOTE — Progress Notes (Signed)
Eddie Glass - 55 y.o. male MRN [id number]  Date of birth: 08-17-1965  Subjective Chief Complaint  Patient presents with  . Establish Care  . Breathing Problem    HPI Eddie Glass is a 55 y.o. male here today for initial visit to establish care.  He recently had annual exam with his previous PCP.  He has concerns today about respiratory difficulties.  Reports history of asthma but has had increased dyspnea and wheezing recently.  Previous PCP recently started him on Advair, he is taking singulair and using albuterol as needed. He denies chest pain, palpitations, fever, chills or leg edema.  He denies history of smoking.  He did have COVID x2, most recent was about 1 year ago.    He also has concerns about abnormal weight gain.  His recent a1c was elevated at 6.6%.  He tries to stay pretty active but admits he has trouble with his diet.  He would like to discuss medication options for weight loss.   ROS:  A comprehensive ROS was completed and negative except as noted per HPI  Allergies  Allergen Reactions  . Other Anaphylaxis    Cats    Past Medical History:  Diagnosis Date  . Allergy    seasonal  . Anemia   . Asthma   . COVID-19 08/31/2019  . Hyperlipidemia 07/01/2018    History reviewed. No pertinent surgical history.  Social History   Socioeconomic History  . Marital status: Single    Spouse name: Not on file  . Number of children: 1  . Years of education: Not on file  . Highest education level: Not on file  Occupational History  . Occupation: [employer] laboratory technician  Tobacco Use  . Smoking status: Never Smoker  . Smokeless tobacco: Never Used  Vaping Use  . Vaping Use: Never used  Substance and Sexual Activity  . Alcohol use: Yes    Alcohol/week: 5.0 standard drinks    Types: 5 Standard drinks or equivalent per week    Comment: 0-3 times a week, social, 2-3 drinks of everything, burbon club  . Drug use: Never  . Sexual activity: Not Currently    Partners: Female   Other Topics Concern  . Not on file  Social History Narrative  . Not on file   Social Determinants of Health   Financial Resource Strain: Not on file  Food Insecurity: Not on file  Transportation Needs: Not on file  Physical Activity: Not on file  Stress: Not on file  Social Connections: Not on file    Family History  Problem Relation Age of Onset  . Diabetes Mother   . Cancer Father        lung cancer  . Colon cancer Neg Hx   . Esophageal cancer Neg Hx   . Rectal cancer Neg Hx   . Stomach cancer Neg Hx     Health Maintenance  Topic Date Due  . Hepatitis C Screening  Never done  . PNEUMOCOCCAL POLYSACCHARIDE VACCINE AGE 51-64 HIGH RISK  Never done  . FOOT EXAM  Never done  . OPHTHALMOLOGY EXAM  Never done  . URINE MICROALBUMIN  Never done  . HEMOGLOBIN A1C  03/05/2021  . COLONOSCOPY (Pts 45-34yrs Insurance coverage will need to be confirmed)  03/20/2023  . TETANUS/TDAP  02/13/2028  . INFLUENZA VACCINE  Completed  . COVID-19 Vaccine  Completed  . HIV Screening  Completed  . HPV VACCINES  Aged Out     ----------------------------------------------------------------------------------------------------------------------------------------------------------------------------------------------------------------- Physical  Exam BP 135/83 (BP Location: Left Arm, Patient Position: Sitting, Cuff Size: Large)   Pulse 90   Temp (!) 97.5 F (36.4 C)   Ht 6' 2.02" (1.88 m)   Wt 272 lb 6.4 oz (123.6 kg)   SpO2 94%   BMI 34.96 kg/m   Physical Exam Constitutional:      Appearance: Normal appearance.  Eyes:     General: No scleral icterus. Cardiovascular:     Rate and Rhythm: Normal rate and regular rhythm.  Pulmonary:     Breath sounds: Wheezing (L>R) present.  Musculoskeletal:     Cervical back: Neck supple.     Right lower leg: No edema.     Left lower leg: No edema.  Skin:    General: Skin is warm and dry.  Neurological:     General: No focal deficit present.      Mental Status: He is alert.  Psychiatric:        Mood and Affect: Mood normal.        Behavior: Behavior normal.     ------------------------------------------------------------------------------------------------------------------------------------------------------------------------------------------------------------------- Assessment and Plan  Moderate persistent asthma Continue daily advair and singulair with albuterol as needed.  Will add 12 day steroid taper as initial burst didn't really help.   Will also add azithromycin and get CXR since increased symptoms have been prolonged.   Type 2 diabetes mellitus with hyperglycemia (HCC) Dietary changes discussed.  Update a1c in 3 months.   Abnormal weight gain Options for weight management discussed.  Will start phentermine 37.5mg  daily.  Discussed working on dietary and exercise changes to maximize weight loss.  F/u in about 6 weeks.    Meds ordered this encounter  Medications  . predniSONE (STERAPRED UNI-PAK 48 TAB) 10 MG (48) TBPK tablet    Sig: Taper over 12 days as directed on packaging    Dispense:  48 tablet    Refill:  0  . azithromycin (ZITHROMAX) 250 MG tablet    Sig: Take 2 tabs on day 1 followed by 1 tab on days 2-5    Dispense:  6 tablet    Refill:  0  . phentermine 37.5 MG capsule    Sig: Take 1 capsule (37.5 mg total) by mouth every morning.    Dispense:  30 capsule    Refill:  2    No follow-ups on file.    This visit occurred during the SARS-CoV-2 public health emergency.  Safety protocols were in place, including screening questions prior to the visit, additional usage of staff PPE, and extensive cleaning of exam room while observing appropriate contact time as indicated for disinfecting solutions.

## 2020-10-13 NOTE — Assessment & Plan Note (Signed)
Continue daily advair and singulair with albuterol as needed.  Will add 12 day steroid taper as initial burst didn't really help.   Will also add azithromycin and get CXR since increased symptoms have been prolonged.

## 2020-10-13 NOTE — Patient Instructions (Signed)
Great to meet you today! Stop downstairs and have xray completed.  Start steroid taper and antibiotic.  See me again in 6 weeks to follow up on phentermine.

## 2020-11-14 ENCOUNTER — Ambulatory Visit (INDEPENDENT_AMBULATORY_CARE_PROVIDER_SITE_OTHER): Payer: Commercial Managed Care - PPO | Admitting: Family Medicine

## 2020-11-14 ENCOUNTER — Encounter: Payer: Self-pay | Admitting: Family Medicine

## 2020-11-14 ENCOUNTER — Other Ambulatory Visit: Payer: Self-pay

## 2020-11-14 VITALS — BP 165/90 | HR 91 | Temp 97.9°F | Ht 73.0 in | Wt 268.2 lb

## 2020-11-14 DIAGNOSIS — J439 Emphysema, unspecified: Secondary | ICD-10-CM

## 2020-11-14 DIAGNOSIS — J4541 Moderate persistent asthma with (acute) exacerbation: Secondary | ICD-10-CM

## 2020-11-14 NOTE — Progress Notes (Signed)
HARM JOU - 55 y.o. male MRN [id number]  Date of birth: [birth year]  Subjective Chief Complaint  Patient presents with  . Hypertension    HPI Eddie Glass is a 55 y.o. male her today for follow up of CXR.  He has history of asthma.  Has been using rescue inhaler a little more often this time of year.  He is using advair daily.  Had recent CXR with changes consistent with emphysema.  He is a lifelong non-smoker.  He denies known industrial exposure.  He denies increased shortness of breath today.  No known family history of alpha-1 antitrypsin deficiency.  ROS:  A comprehensive ROS was completed and negative except as noted per HPI  Allergies  Allergen Reactions  . Other Anaphylaxis    Cats    Past Medical History:  Diagnosis Date  . Allergy    seasonal  . Anemia   . Asthma   . COVID-19 08/31/2019  . Hyperlipidemia 07/01/2018    History reviewed. No pertinent surgical history.  Social History   Socioeconomic History  . Marital status: Single    Spouse name: Not on file  . Number of children: 1  . Years of education: Not on file  . Highest education level: Not on file  Occupational History  . Occupation: [employer] laboratory technician  Tobacco Use  . Smoking status: Never Smoker  . Smokeless tobacco: Never Used  Vaping Use  . Vaping Use: Never used  Substance and Sexual Activity  . Alcohol use: Yes    Alcohol/week: 5.0 standard drinks    Types: 5 Standard drinks or equivalent per week    Comment: 0-3 times a week, social, 2-3 drinks of everything, burbon club  . Drug use: Never  . Sexual activity: Not Currently    Partners: Female  Other Topics Concern  . Not on file  Social History Narrative  . Not on file   Social Determinants of Health   Financial Resource Strain: Not on file  Food Insecurity: Not on file  Transportation Needs: Not on file  Physical Activity: Not on file  Stress: Not on file  Social Connections: Not on file    Family History  Problem  Relation Age of Onset  . Diabetes Mother   . Cancer Father        lung cancer  . Colon cancer Neg Hx   . Esophageal cancer Neg Hx   . Rectal cancer Neg Hx   . Stomach cancer Neg Hx     Health Maintenance  Topic Date Due  . Hepatitis C Screening  Never done  . PNEUMOCOCCAL POLYSACCHARIDE VACCINE AGE 36-64 HIGH RISK  Never done  . FOOT EXAM  Never done  . OPHTHALMOLOGY EXAM  Never done  . URINE MICROALBUMIN  Never done  . INFLUENZA VACCINE  02/13/2021  . HEMOGLOBIN A1C  03/05/2021  . COLONOSCOPY (Pts 45-24yrs Insurance coverage will need to be confirmed)  03/20/2023  . TETANUS/TDAP  02/13/2028  . COVID-19 Vaccine  Completed  . HIV Screening  Completed  . HPV VACCINES  Aged Out     ----------------------------------------------------------------------------------------------------------------------------------------------------------------------------------------------------------------- Physical Exam BP (!) 165/90 (BP Location: Left Arm, Patient Position: Sitting, Cuff Size: Large)   Pulse 91   Temp 97.9 F (36.6 C)   Ht 6\' 1"  (1.854 m)   Wt 268 lb 3.2 oz (121.7 kg)   SpO2 94%   BMI 35.38 kg/m   Physical Exam Constitutional:      Appearance: Normal appearance.  HENT:  Head: Normocephalic and atraumatic.  Eyes:     General: No scleral icterus. Cardiovascular:     Rate and Rhythm: Normal rate and regular rhythm.  Pulmonary:     Effort: Pulmonary effort is normal.     Breath sounds: Normal breath sounds.  Musculoskeletal:     Cervical back: Neck supple.  Skin:    General: Skin is warm and dry.  Neurological:     General: No focal deficit present.     Mental Status: He is alert.     ------------------------------------------------------------------------------------------------------------------------------------------------------------------------------------------------------------------- Assessment and Plan  Moderate persistent asthma Continue advair  and singulair with albuterol as needed.  CXR with emphysematous changes.  Checking alpha 1 antitrypsin labs today.    No orders of the defined types were placed in this encounter.   No follow-ups on file.    This visit occurred during the SARS-CoV-2 public health emergency.  Safety protocols were in place, including screening questions prior to the visit, additional usage of staff PPE, and extensive cleaning of exam room while observing appropriate contact time as indicated for disinfecting solutions.

## 2020-11-14 NOTE — Patient Instructions (Addendum)
Continue current medications Have labs completed.  See me again in 3 months.

## 2020-11-14 NOTE — Assessment & Plan Note (Signed)
Continue advair and singulair with albuterol as needed.  CXR with emphysematous changes.  Checking alpha 1 antitrypsin labs today.

## 2020-12-01 LAB — ALPHA-1 ANTITRYPSIN PHENOTYPE: A-1 Antitrypsin, Ser: 147 mg/dL (ref 83–199)

## 2020-12-29 ENCOUNTER — Encounter: Payer: Self-pay | Admitting: Family Medicine

## 2020-12-29 ENCOUNTER — Telehealth (INDEPENDENT_AMBULATORY_CARE_PROVIDER_SITE_OTHER): Payer: Commercial Managed Care - PPO | Admitting: Family Medicine

## 2020-12-29 DIAGNOSIS — N529 Male erectile dysfunction, unspecified: Secondary | ICD-10-CM

## 2020-12-29 MED ORDER — TADALAFIL 20 MG PO TABS: 10.0000 mg | ORAL_TABLET | ORAL | 11 refills | Status: AC | PRN

## 2020-12-29 NOTE — Progress Notes (Signed)
Eddie Glass - 55 y.o. male MRN [id number]  Date of birth: 1966-04-04   This visit type was conducted due to national recommendations for restrictions regarding the COVID-19 Pandemic (e.g. social distancing).  This format is felt to be most appropriate for this patient at this time.  All issues noted in this document were discussed and addressed.  No physical exam was performed (except for noted visual exam findings with Video Visits).  I discussed the limitations of evaluation and management by telemedicine and the availability of in person appointments. The patient expressed understanding and agreed to proceed.  I connected withNAME@ on 12/29/20 at 10:50 AM EDT by a video enabled telemedicine application and verified that I am speaking with the correct person using two identifiers.  Interactive audio and video telecommunications were attempted between this provider and patient, however failed, due to patient having technical difficulties OR patient did not have access to video capability.  We continued and completed visit with audio only.    Present at visit: Eddie Nutting, DO Eddie Glass   Patient Location: Home [address] [place] [address]   Provider location:   [facility]  Chief Complaint  Patient presents with   Follow-up    HPI  Eddie Glass is a 55 y.o. male who presents via audio/video conferencing for a telehealth visit today.  He reports some issues with ED and is interested in trying medication to help with this.  He is able to initially achieve erection but has difficulty maintaining.  He has never tried medication for this issue in the past.     ROS:  A comprehensive ROS was completed and negative except as noted per HPI  Past Medical History:  Diagnosis Date   Allergy    seasonal   Anemia    Asthma    COVID-19 08/31/2019   Hyperlipidemia 07/01/2018    No past surgical history on file.  Family History  Problem Relation Age of Onset   Diabetes  Mother    Cancer Father        lung cancer   Colon cancer Neg Hx    Esophageal cancer Neg Hx    Rectal cancer Neg Hx    Stomach cancer Neg Hx     Social History   Socioeconomic History   Marital status: Single    Spouse name: Not on file   Number of children: 1   Years of education: Not on file   Highest education level: Not on file  Occupational History   Occupation: [employer] laboratory technician  Tobacco Use   Smoking status: Never   Smokeless tobacco: Never  Vaping Use   Vaping Use: Never used  Substance and Sexual Activity   Alcohol use: Yes    Alcohol/week: 5.0 standard drinks    Types: 5 Standard drinks or equivalent per week    Comment: 0-3 times a week, social, 2-3 drinks of everything, burbon club   Drug use: Never   Sexual activity: Not Currently    Partners: Female  Other Topics Concern   Not on file  Social History Narrative   Not on file   Social Determinants of Health   Financial Resource Strain: Not on file  Food Insecurity: Not on file  Transportation Needs: Not on file  Physical Activity: Not on file  Stress: Not on file  Social Connections: Not on file  Intimate Partner Violence: Not on file     Current Outpatient Medications:    albuterol (PROVENTIL) (  2.5 MG/3ML) 0.083% nebulizer solution, Take 3 mLs (2.5 mg total) by nebulization every 6 (six) hours as needed for wheezing or shortness of breath., Disp: 150 mL, Rfl: 1   albuterol (VENTOLIN HFA) 108 (90 Base) MCG/ACT inhaler, Inhale 2 puffs into the lungs every 6 (six) hours as needed for wheezing or shortness of breath., Disp: 18 g, Rfl: 3   Cholecalciferol (VITAMIN D) 50 MCG (2000 UT) tablet, Take 1 tablet (2,000 Units total) by mouth daily., Disp: 90 tablet, Rfl: 0   ferrous sulfate 325 (65 FE) MG EC tablet, Take 1 tablet (325 mg total) by mouth daily with breakfast., Disp: 90 tablet, Rfl: 0   Fluticasone-Salmeterol (ADVAIR DISKUS) 500-50 MCG/DOSE AEPB, Inhale 1 puff into the lungs 2 (two) times daily.  Rinse mouth out after using., Disp: 60 each, Rfl: 3   phentermine 37.5 MG capsule, Take 1 capsule (37.5 mg total) by mouth every morning., Disp: 30 capsule, Rfl: 2   tadalafil (CIALIS) 20 MG tablet, Take 0.5-1 tablets (10-20 mg total) by mouth every other day as needed for erectile dysfunction., Disp: 15 tablet, Rfl: 11  Current Facility-Administered Medications:    ipratropium-albuterol (DUONEB) 0.5-2.5 (3) MG/3ML nebulizer solution 3 mL, 3 mL, Nebulization, Once, Bates, Crystal A, FNP  EXAM:  VITALS per patient if applicable: Ht 6\' 1"  (1.854 m)   Wt 268 lb 3.2 oz (121.7 kg)   BMI 35.38 kg/m   GENERAL: alert, oriented, in no acute distress  PSYCH/NEURO: pleasant and cooperative, no obvious depression or anxiety, speech and thought processing grossly intact  ASSESSMENT AND PLAN:  Discussed the following assessment and plan:  Erectile dysfunction Medication options discussed.  Will provide trial of tadalafil 10-20mg  as needed Side effects including headache and congestion reviewed.  Red flags and precautions reviewed including prolonged erection and avoidance of nitroglycerin containing medications.      30 minutes spent including pre visit preparation, review of prior notes and labs, encounter with patient via video visit and same day documentation.  I discussed the assessment and treatment plan with the patient. The patient was provided an opportunity to ask questions and all were answered. The patient agreed with the plan and demonstrated an understanding of the instructions.   The patient was advised to call back or seek an in-person evaluation if the symptoms worsen or if the condition fails to improve as anticipated.    Eddie Nutting, DO

## 2020-12-29 NOTE — Progress Notes (Signed)
Not having any concerns. Consultation.

## 2020-12-29 NOTE — Assessment & Plan Note (Signed)
Medication options discussed.  Will provide trial of tadalafil 10-20mg  as needed Side effects including headache and congestion reviewed.  Red flags and precautions reviewed including prolonged erection and avoidance of nitroglycerin containing medications.

## 2021-02-20 ENCOUNTER — Encounter: Payer: Self-pay | Admitting: Family Medicine

## 2021-02-20 ENCOUNTER — Ambulatory Visit (INDEPENDENT_AMBULATORY_CARE_PROVIDER_SITE_OTHER): Payer: Commercial Managed Care - PPO | Admitting: Family Medicine

## 2021-02-20 VITALS — BP 150/95 | HR 93 | Temp 97.8°F | Ht 73.0 in | Wt 259.0 lb

## 2021-02-20 DIAGNOSIS — I10 Essential (primary) hypertension: Secondary | ICD-10-CM

## 2021-02-20 DIAGNOSIS — N529 Male erectile dysfunction, unspecified: Secondary | ICD-10-CM

## 2021-02-20 DIAGNOSIS — E1165 Type 2 diabetes mellitus with hyperglycemia: Secondary | ICD-10-CM | POA: Diagnosis not present

## 2021-02-20 LAB — POCT GLYCOSYLATED HEMOGLOBIN (HGB A1C): HbA1c, POC (controlled diabetic range): 6.5 % (ref 0.0–7.0)

## 2021-02-20 MED ORDER — LISINOPRIL-HYDROCHLOROTHIAZIDE 20-12.5 MG PO TABS: 1.0000 | ORAL_TABLET | Freq: Every day | ORAL | 1 refills | Status: DC

## 2021-02-20 NOTE — Progress Notes (Signed)
Eddie Glass - 55 y.o. male MRN [id number]  Date of birth: [birth year]  Subjective Chief Complaint  Patient presents with   Hypertension    HPI Eddie Glass is a 55 year old male here today for follow-up of elevated blood pressure.  He is not currently on any medications.  Reports that he was recently visiting a friend who was terminally ill at [facility] and was noted to have elevated blood pressure there.  He has not had any symptoms related to hypertension including chest pain, shortness of breath, headache or vision changes.  He does admit that he could make some improvements to his diet and has already started working on this.  He does not like the idea of needing to take a medication chronically.  He has used phentermine intermittently for help with weight loss.  He only takes this about once per week currently.  He does not feel like this has any adverse effect on his blood pressure when he has taken this previously.  Tadalafil is working well for management of ED symptoms.  ROS:  A comprehensive ROS was completed and negative except as noted per HPI  Allergies  Allergen Reactions   Other Anaphylaxis    Cats    Past Medical History:  Diagnosis Date   Allergy    seasonal   Anemia    Asthma    COVID-19 08/31/2019   Hyperlipidemia 07/01/2018    History reviewed. No pertinent surgical history.  Social History   Socioeconomic History   Marital status: Single    Spouse name: Not on file   Number of children: 1   Years of education: Not on file   Highest education level: Not on file  Occupational History   Occupation: [employer]  Tobacco Use   Smoking status: Never   Smokeless tobacco: Never  Vaping Use   Vaping Use: Never used  Substance and Sexual Activity   Alcohol use: Yes    Alcohol/week: 5.0 standard drinks    Types: 5 Standard drinks or equivalent per week    Comment: 0-3 times a week, social, 2-3 drinks of everything, burbon club   Drug use: Never   Sexual  activity: Not Currently    Partners: Female  Other Topics Concern   Not on file  Social History Narrative   Not on file   Social Determinants of Health   Financial Resource Strain: Not on file  Food Insecurity: Not on file  Transportation Needs: Not on file  Physical Activity: Not on file  Stress: Not on file  Social Connections: Not on file    Family History  Problem Relation Age of Onset   Diabetes Mother    Cancer Father        lung cancer   Colon cancer Neg Hx    Esophageal cancer Neg Hx    Rectal cancer Neg Hx    Stomach cancer Neg Hx     Health Maintenance  Topic Date Due   PNEUMOCOCCAL POLYSACCHARIDE VACCINE AGE 25-64 HIGH RISK  Never done   FOOT EXAM  Never done   OPHTHALMOLOGY EXAM  Never done   URINE MICROALBUMIN  Never done   Hepatitis C Screening  Never done   COVID-19 Vaccine (3 - Booster for Janssen series) 09/24/2020   INFLUENZA VACCINE  02/13/2021   HEMOGLOBIN A1C  08/23/2021   COLONOSCOPY (Pts 45-66yr Insurance coverage will need to be confirmed)  03/20/2023   TETANUS/TDAP  02/13/2028   HIV Screening  Completed   Zoster  Vaccines- Shingrix  Completed   Pneumococcal Vaccine 70-41 Years old  Aged Out   HPV VACCINES  Aged Out     ----------------------------------------------------------------------------------------------------------------------------------------------------------------------------------------------------------------- Physical Exam BP (!) 150/95 (BP Location: Left Arm, Patient Position: Sitting, Cuff Size: Large)   Pulse 93   Temp 97.8 F (36.6 C)   Ht '6\' 1"'$  (1.854 m)   Wt 259 lb (117.5 kg)   SpO2 96%   BMI 34.17 kg/m   Physical Exam Constitutional:      Appearance: Normal appearance.  Eyes:     General: No scleral icterus. Cardiovascular:     Rate and Rhythm: Normal rate and regular rhythm.  Pulmonary:     Effort: Pulmonary effort is normal.     Breath sounds: Normal breath sounds.  Musculoskeletal:     Cervical  back: Neck supple.  Neurological:     General: No focal deficit present.     Mental Status: He is alert.  Psychiatric:        Mood and Affect: Mood normal.        Behavior: Behavior normal.    ------------------------------------------------------------------------------------------------------------------------------------------------------------------------------------------------------------------- Assessment and Plan  Type 2 diabetes mellitus with hyperglycemia (HCC) Slight improvement to A1c, down to 6.5%.  Encouraged to continue to work on dietary changes.  Essential hypertension Blood pressure remains elevated.  Recommend continued dietary change including following a low-sodium diet.  Adding lisinopril/hydrochlorothiazide.  Return in a few weeks to recheck blood pressure as well as renal functio/ potassium.  Erectile dysfunction Doing well with tadalafil.  Continue as needed.   Meds ordered this encounter  Medications   lisinopril-hydrochlorothiazide (ZESTORETIC) 20-12.5 MG tablet    Sig: Take 1 tablet by mouth daily.    Dispense:  90 tablet    Refill:  1    Return in about 4 weeks (around 03/20/2021) for HTN.    This visit occurred during the SARS-CoV-2 public health emergency.  Safety protocols were in place, including screening questions prior to the visit, additional usage of staff PPE, and extensive cleaning of exam room while observing appropriate contact time as indicated for disinfecting solutions.

## 2021-02-20 NOTE — Assessment & Plan Note (Addendum)
Slight improvement to A1c, down to 6.5%.  Encouraged to continue to work on dietary changes.

## 2021-02-20 NOTE — Patient Instructions (Signed)
https://www.nhlbi.nih.gov/files/docs/public/heart/dash_brief.pdf">  DASH Eating Plan DASH stands for Dietary Approaches to Stop Hypertension. The DASH eating plan is a healthy eating plan that has been shown to: Reduce high blood pressure (hypertension). Reduce your risk for type 2 diabetes, heart disease, and stroke. Help with weight loss. What are tips for following this plan? Reading food labels Check food labels for the amount of salt (sodium) per serving. Choose foods with less than 5 percent of the Daily Value of sodium. Generally, foods with less than 300 milligrams (mg) of sodium per serving fit into this eating plan. To find whole grains, look for the word "whole" as the first word in the ingredient list. Shopping Buy products labeled as "low-sodium" or "no salt added." Buy fresh foods. Avoid canned foods and pre-made or frozen meals. Cooking Avoid adding salt when cooking. Use salt-free seasonings or herbs instead of table salt or sea salt. Check with your health care provider or pharmacist before using salt substitutes. Do not fry foods. Cook foods using healthy methods such as baking, boiling, grilling, roasting, and broiling instead. Cook with heart-healthy oils, such as olive, canola, avocado, soybean, or sunflower oil. Meal planning  Eat a balanced diet that includes: 4 or more servings of fruits and 4 or more servings of vegetables each day. Try to fill one-half of your plate with fruits and vegetables. 6-8 servings of whole grains each day. Less than 6 oz (170 g) of lean meat, poultry, or fish each day. A 3-oz (85-g) serving of meat is about the same size as a deck of cards. One egg equals 1 oz (28 g). 2-3 servings of low-fat dairy each day. One serving is 1 cup (237 mL). 1 serving of nuts, seeds, or beans 5 times each week. 2-3 servings of heart-healthy fats. Healthy fats called omega-3 fatty acids are found in foods such as walnuts, flaxseeds, fortified milks, and eggs.  These fats are also found in cold-water fish, such as sardines, salmon, and mackerel. Limit how much you eat of: Canned or prepackaged foods. Food that is high in trans fat, such as some fried foods. Food that is high in saturated fat, such as fatty meat. Desserts and other sweets, sugary drinks, and other foods with added sugar. Full-fat dairy products. Do not salt foods before eating. Do not eat more than 4 egg yolks a week. Try to eat at least 2 vegetarian meals a week. Eat more home-cooked food and less restaurant, buffet, and fast food.  Lifestyle When eating at a restaurant, ask that your food be prepared with less salt or no salt, if possible. If you drink alcohol: Limit how much you use to: 0-1 drink a day for women who are not pregnant. 0-2 drinks a day for men. Be aware of how much alcohol is in your drink. In the [place] one drink equals one 12 oz bottle of beer (355 mL), one 5 oz glass of wine (148 mL), or one 1 oz glass of hard liquor (44 mL). General information Avoid eating more than 2,300 mg of salt a day. If you have hypertension, you may need to reduce your sodium intake to 1,500 mg a day. Work with your health care provider to maintain a healthy body weight or to lose weight. Ask what an ideal weight is for you. Get at least 30 minutes of exercise that causes your heart to beat faster (aerobic exercise) most days of the week. Activities may include walking, swimming, or biking. Work with your health care provider   or dietitian to adjust your eating plan to your individual calorie needs. What foods should I eat? Fruits All fresh, dried, or frozen fruit. Canned fruit in natural juice (without addedsugar). Vegetables Fresh or frozen vegetables (raw, steamed, roasted, or grilled). Low-sodium or reduced-sodium tomato and vegetable juice. Low-sodium or reduced-sodium tomatosauce and tomato paste. Low-sodium or reduced-sodium canned vegetables. Grains Whole-grain or  whole-wheat bread. Whole-grain or whole-wheat pasta. Brown rice. Oatmeal. Quinoa. Bulgur. Whole-grain and low-sodium cereals. Pita bread.Low-fat, low-sodium crackers. Whole-wheat flour tortillas. Meats and other proteins Skinless chicken or turkey. Ground chicken or turkey. Pork with fat trimmed off. Fish and seafood. Egg whites. Dried beans, peas, or lentils. Unsalted nuts, nut butters, and seeds. Unsalted canned beans. Lean cuts of beef with fat trimmed off. Low-sodium, lean precooked or cured meat, such as sausages or meatloaves. Dairy Low-fat (1%) or fat-free (skim) milk. Reduced-fat, low-fat, or fat-free cheeses. Nonfat, low-sodium ricotta or cottage cheese. Low-fat or nonfatyogurt. Low-fat, low-sodium cheese. Fats and oils Soft margarine without trans fats. Vegetable oil. Reduced-fat, low-fat, or light mayonnaise and salad dressings (reduced-sodium). Canola, safflower, olive, avocado, soybean, andsunflower oils. Avocado. Seasonings and condiments Herbs. Spices. Seasoning mixes without salt. Other foods Unsalted popcorn and pretzels. Fat-free sweets. The items listed above may not be a complete list of foods and beverages you can eat. Contact a dietitian for more information. What foods should I avoid? Fruits Canned fruit in a light or heavy syrup. Fried fruit. Fruit in cream or buttersauce. Vegetables Creamed or fried vegetables. Vegetables in a cheese sauce. Regular canned vegetables (not low-sodium or reduced-sodium). Regular canned tomato sauce and paste (not low-sodium or reduced-sodium). Regular tomato and vegetable juice(not low-sodium or reduced-sodium). Pickles. Olives. Grains Baked goods made with fat, such as croissants, muffins, or some breads. Drypasta or rice meal packs. Meats and other proteins Fatty cuts of meat. Ribs. Fried meat. Bacon. Bologna, salami, and other precooked or cured meats, such as sausages or meat loaves. Fat from the back of a pig (fatback). Bratwurst.  Salted nuts and seeds. Canned beans with added salt. Canned orsmoked fish. Whole eggs or egg yolks. Chicken or turkey with skin. Dairy Whole or 2% milk, cream, and half-and-half. Whole or full-fat cream cheese. Whole-fat or sweetened yogurt. Full-fat cheese. Nondairy creamers. Whippedtoppings. Processed cheese and cheese spreads. Fats and oils Butter. Stick margarine. Lard. Shortening. Ghee. Bacon fat. Tropical oils, suchas coconut, palm kernel, or palm oil. Seasonings and condiments Onion salt, garlic salt, seasoned salt, table salt, and sea salt. Worcestershire sauce. Tartar sauce. Barbecue sauce. Teriyaki sauce. Soy sauce, including reduced-sodium. Steak sauce. Canned and packaged gravies. Fish sauce. Oyster sauce. Cocktail sauce. Store-bought horseradish. Ketchup. Mustard. Meat flavorings and tenderizers. Bouillon cubes. Hot sauces. Pre-made or packaged marinades. Pre-made or packaged taco seasonings. Relishes. Regular saladdressings. Other foods Salted popcorn and pretzels. The items listed above may not be a complete list of foods and beverages you should avoid. Contact a dietitian for more information. Where to find more information National Heart, Lung, and Blood Institute: www.nhlbi.nih.gov American Heart Association: www.heart.org Academy of Nutrition and Dietetics: www.eatright.org National Kidney Foundation: www.kidney.org Summary The DASH eating plan is a healthy eating plan that has been shown to reduce high blood pressure (hypertension). It may also reduce your risk for type 2 diabetes, heart disease, and stroke. When on the DASH eating plan, aim to eat more fresh fruits and vegetables, whole grains, lean proteins, low-fat dairy, and heart-healthy fats. With the DASH eating plan, you should limit salt (sodium) intake to 2,300   mg a day. If you have hypertension, you may need to reduce your sodium intake to 1,500 mg a day. Work with your health care provider or dietitian to adjust  your eating plan to your individual calorie needs. This information is not intended to replace advice given to you by your health care provider. Make sure you discuss any questions you have with your healthcare provider. Document Revised: 06/05/2019 Document Reviewed: 06/05/2019 Elsevier Patient Education  2022 Elsevier Inc.  

## 2021-02-20 NOTE — Assessment & Plan Note (Signed)
Blood pressure remains elevated.  Recommend continued dietary change including following a low-sodium diet.  Adding lisinopril/hydrochlorothiazide.  Return in a few weeks to recheck blood pressure as well as renal functio/ potassium.

## 2021-02-20 NOTE — Assessment & Plan Note (Addendum)
Doing well with tadalafil.  Continue as needed.

## 2021-03-23 ENCOUNTER — Ambulatory Visit (INDEPENDENT_AMBULATORY_CARE_PROVIDER_SITE_OTHER): Payer: Commercial Managed Care - PPO | Admitting: Family Medicine

## 2021-03-23 ENCOUNTER — Encounter: Payer: Self-pay | Admitting: Family Medicine

## 2021-03-23 ENCOUNTER — Other Ambulatory Visit: Payer: Self-pay

## 2021-03-23 VITALS — BP 134/78 | HR 70 | Wt 256.0 lb

## 2021-03-23 DIAGNOSIS — I10 Essential (primary) hypertension: Secondary | ICD-10-CM | POA: Diagnosis not present

## 2021-03-23 LAB — BASIC METABOLIC PANEL
BUN/Creatinine Ratio: 14 (calc) (ref 6–22)
BUN: 21 mg/dL (ref 7–25)
CO2: 29 mmol/L (ref 20–32)
Calcium: 9.4 mg/dL (ref 8.6–10.3)
Chloride: 103 mmol/L (ref 98–110)
Creat: 1.47 mg/dL — ABNORMAL HIGH (ref 0.70–1.30)
Glucose, Bld: 111 mg/dL (ref 65–139)
Potassium: 4.3 mmol/L (ref 3.5–5.3)
Sodium: 139 mmol/L (ref 135–146)

## 2021-03-23 NOTE — Assessment & Plan Note (Signed)
His blood pressure is much better controlled today since adding lisinopril with hydrochlorothiazide.  I recommend that he continue this at current strength.  Low-sodium diet encouraged.  Rechecking BMP today for renal function and potassium levels. Return in about 6 months (around 09/20/2021) for HTN/DM.

## 2021-03-23 NOTE — Patient Instructions (Signed)
Managing Your Hypertension Hypertension, also called high blood pressure, is when the force of the blood pressing against the walls of the arteries is too strong. Arteries are blood vessels that carry blood from your heart throughout your body. Hypertension forces the heart to work harder to pump blood and may cause the arteries tobecome narrow or stiff. Understanding blood pressure readings Your personal target blood pressure may vary depending on your medical conditions, your age, and other factors. A blood pressure reading includes a higher number over a lower number. Ideally, your blood pressure should be below 120/80. You should know that: The first, or top, number is called the systolic pressure. It is a measure of the pressure in your arteries as your heart beats. The second, or bottom number, is called the diastolic pressure. It is a measure of the pressure in your arteries as the heart relaxes. Blood pressure is classified into four stages. Based on your blood pressure reading, your health care provider may use the following stages to determine what type of treatment you need, if any. Systolic pressure and diastolicpressure are measured in a unit called mmHg. Normal Systolic pressure: below 120. Diastolic pressure: below 80. Elevated Systolic pressure: 120-129. Diastolic pressure: below 80. Hypertension stage 1 Systolic pressure: 130-139. Diastolic pressure: 80-89. Hypertension stage 2 Systolic pressure: 140 or above. Diastolic pressure: 90 or above. How can this condition affect me? Managing your hypertension is an important responsibility. Over time, hypertension can damage the arteries and decrease blood flow to important parts of the body, including the brain, heart, and kidneys. Having untreated or uncontrolled hypertension can lead to: A heart attack. A stroke. A weakened blood vessel (aneurysm). Heart failure. Kidney damage. Eye damage. Metabolic syndrome. Memory and  concentration problems. Vascular dementia. What actions can I take to manage this condition? Hypertension can be managed by making lifestyle changes and possibly by taking medicines. Your health care provider will help you make a plan to bring yourblood pressure within a normal range. Nutrition  Eat a diet that is high in fiber and potassium, and low in salt (sodium), added sugar, and fat. An example eating plan is called the Dietary Approaches to Stop Hypertension (DASH) diet. To eat this way: Eat plenty of fresh fruits and vegetables. Try to fill one-half of your plate at each meal with fruits and vegetables. Eat whole grains, such as whole-wheat pasta, brown rice, or whole-grain bread. Fill about one-fourth of your plate with whole grains. Eat low-fat dairy products. Avoid fatty cuts of meat, processed or cured meats, and poultry with skin. Fill about one-fourth of your plate with lean proteins such as fish, chicken without skin, beans, eggs, and tofu. Avoid pre-made and processed foods. These tend to be higher in sodium, added sugar, and fat. Reduce your daily sodium intake. Most people with hypertension should eat less than 1,500 mg of sodium a day.  Lifestyle  Work with your health care provider to maintain a healthy body weight or to lose weight. Ask what an ideal weight is for you. Get at least 30 minutes of exercise that causes your heart to beat faster (aerobic exercise) most days of the week. Activities may include walking, swimming, or biking. Include exercise to strengthen your muscles (resistance exercise), such as weight lifting, as part of your weekly exercise routine. Try to do these types of exercises for 30 minutes at least 3 days a week. Do not use any products that contain nicotine or tobacco, such as cigarettes, e-cigarettes, and chewing   tobacco. If you need help quitting, ask your health care provider. Control any long-term (chronic) conditions you have, such as high  cholesterol or diabetes. Identify your sources of stress and find ways to manage stress. This may include meditation, deep breathing, or making time for fun activities.  Alcohol use Do not drink alcohol if: Your health care provider tells you not to drink. You are pregnant, may be pregnant, or are planning to become pregnant. If you drink alcohol: Limit how much you use to: 0-1 drink a day for women. 0-2 drinks a day for men. Be aware of how much alcohol is in your drink. In the [place] one drink equals one 12 oz bottle of beer (355 mL), one 5 oz glass of wine (148 mL), or one 1 oz glass of hard liquor (44 mL). Medicines Your health care provider may prescribe medicine if lifestyle changes are not enough to get your blood pressure under control and if: Your systolic blood pressure is 130 or higher. Your diastolic blood pressure is 80 or higher. Take medicines only as told by your health care provider. Follow the directions carefully. Blood pressure medicines must be taken as told by your health care provider. The medicine does not work as well when you skip doses. Skippingdoses also puts you at risk for problems. Monitoring Before you monitor your blood pressure: Do not smoke, drink caffeinated beverages, or exercise within 30 minutes before taking a measurement. Use the bathroom and empty your bladder (urinate). Sit quietly for at least 5 minutes before taking measurements. Monitor your blood pressure at home as told by your health care provider. To do this: Sit with your back straight and supported. Place your feet flat on the floor. Do not cross your legs. Support your arm on a flat surface, such as a table. Make sure your upper arm is at heart level. Each time you measure, take two or three readings one minute apart and record the results. You may also need to have your blood pressure checked regularly by your healthcare provider. General information Talk with your health care  provider about your diet, exercise habits, and other lifestyle factors that may be contributing to hypertension. Review all the medicines you take with your health care provider because there may be side effects or interactions. Keep all visits as told by your health care provider. Your health care provider can help you create and adjust your plan for managing your high blood pressure. Where to find more information National Heart, Lung, and Blood Institute: www.nhlbi.nih.gov American Heart Association: www.heart.org Contact a health care provider if: You think you are having a reaction to medicines you have taken. You have repeated (recurrent) headaches. You feel dizzy. You have swelling in your ankles. You have trouble with your vision. Get help right away if: You develop a severe headache or confusion. You have unusual weakness or numbness, or you feel faint. You have severe pain in your chest or abdomen. You vomit repeatedly. You have trouble breathing. These symptoms may represent a serious problem that is an emergency. Do not wait to see if the symptoms will go away. Get medical help right away. Call your local emergency services (911 in the [place] Do not drive yourself to the hospital. Summary Hypertension is when the force of blood pumping through your arteries is too strong. If this condition is not controlled, it may put you at risk for serious complications. Your personal target blood pressure may vary depending on your medical conditions,   your age, and other factors. For most people, a normal blood pressure is less than 120/80. Hypertension is managed by lifestyle changes, medicines, or both. Lifestyle changes to help manage hypertension include losing weight, eating a healthy, low-sodium diet, exercising more, stopping smoking, and limiting alcohol. This information is not intended to replace advice given to you by your health care provider. Make sure you discuss any questions  you have with your healthcare provider. Document Revised: 08/07/2019 Document Reviewed: 06/02/2019 Elsevier Patient Education  2022 Elsevier Inc.  

## 2021-03-23 NOTE — Progress Notes (Signed)
Eddie Glass - 55 y.o. male MRN [id number]  Date of birth: 03-11-1966  Subjective Chief Complaint  Patient presents with   Hypertension    HPI Eddie Glass Is a 55 year old male here today for follow-up of hypertension.  Blood pressure elevated at last visit with some minimal lisinopril/hydrochlorothiazide.  He reports he is doing well and has not noted any side effects with this.  Blood pressure is much better controlled today.  He is continue to work on diet and exercise changes well to better his health.  He denies chest pain, shortness of breath, palpitations, headache or vision changes.  ROS:  A comprehensive ROS was completed and negative except as noted per HPI  Allergies  Allergen Reactions   Other Anaphylaxis    Cats    Past Medical History:  Diagnosis Date   Allergy    seasonal   Anemia    Asthma    COVID-19 08/31/2019   Hyperlipidemia 07/01/2018    History reviewed. No pertinent surgical history.  Social History   Socioeconomic History   Marital status: Single    Spouse name: Not on file   Number of children: 1   Years of education: Not on file   Highest education level: Not on file  Occupational History   Occupation: [employer]  Tobacco Use   Smoking status: Never   Smokeless tobacco: Never  Vaping Use   Vaping Use: Never used  Substance and Sexual Activity   Alcohol use: Yes    Alcohol/week: 5.0 standard drinks    Types: 5 Standard drinks or equivalent per week    Comment: 0-3 times a week, social, 2-3 drinks of everything, burbon club   Drug use: Never   Sexual activity: Not Currently    Partners: Female  Other Topics Concern   Not on file  Social History Narrative   Not on file   Social Determinants of Health   Financial Resource Strain: Not on file  Food Insecurity: Not on file  Transportation Needs: Not on file  Physical Activity: Not on file  Stress: Not on file  Social Connections: Not on file    Family History  Problem Relation  Age of Onset   Diabetes Mother    Cancer Father        lung cancer   Colon cancer Neg Hx    Esophageal cancer Neg Hx    Rectal cancer Neg Hx    Stomach cancer Neg Hx     Health Maintenance  Topic Date Due   PNEUMOCOCCAL POLYSACCHARIDE VACCINE AGE 43-64 HIGH RISK  Never done   Pneumococcal Vaccine 22-51 Years old (1 - PCV) Never done   FOOT EXAM  Never done   OPHTHALMOLOGY EXAM  Never done   Hepatitis C Screening  Never done   COVID-19 Vaccine (3 - Booster for Janssen series) 09/24/2020   INFLUENZA VACCINE  10/13/2021 (Originally 02/13/2021)   HEMOGLOBIN A1C  08/23/2021   COLONOSCOPY (Pts 45-34yr Insurance coverage will need to be confirmed)  03/20/2023   TETANUS/TDAP  02/13/2028   HIV Screening  Completed   Zoster Vaccines- Shingrix  Completed   HPV VACCINES  Aged Out     ----------------------------------------------------------------------------------------------------------------------------------------------------------------------------------------------------------------- Physical Exam BP 134/78   Pulse 70   Wt 256 lb (116.1 kg)   SpO2 97% Comment: on RA  BMI 33.78 kg/m   Physical Exam Constitutional:      Appearance: Normal appearance.  HENT:     Head: Normocephalic and atraumatic.  Eyes:  General: No scleral icterus. Cardiovascular:     Rate and Rhythm: Normal rate and regular rhythm.  Pulmonary:     Effort: Pulmonary effort is normal.     Breath sounds: Normal breath sounds.  Musculoskeletal:     Cervical back: Neck supple.  Neurological:     General: No focal deficit present.     Mental Status: He is alert.  Psychiatric:        Mood and Affect: Mood normal.        Behavior: Behavior normal.    ------------------------------------------------------------------------------------------------------------------------------------------------------------------------------------------------------------------- Assessment and Plan  Essential  hypertension His blood pressure is much better controlled today since adding lisinopril with hydrochlorothiazide.  I recommend that he continue this at current strength.  Low-sodium diet encouraged.  Rechecking BMP today for renal function and potassium levels. Return in about 6 months (around 09/20/2021) for HTN/DM.   No orders of the defined types were placed in this encounter.   Return in about 6 months (around 09/20/2021) for HTN/DM.    This visit occurred during the SARS-CoV-2 public health emergency.  Safety protocols were in place, including screening questions prior to the visit, additional usage of staff PPE, and extensive cleaning of exam room while observing appropriate contact time as indicated for disinfecting solutions.

## 2021-06-13 ENCOUNTER — Other Ambulatory Visit: Payer: Self-pay

## 2021-06-13 DIAGNOSIS — R0689 Other abnormalities of breathing: Secondary | ICD-10-CM

## 2021-06-13 DIAGNOSIS — U071 COVID-19: Secondary | ICD-10-CM

## 2021-06-13 DIAGNOSIS — J45909 Unspecified asthma, uncomplicated: Secondary | ICD-10-CM

## 2021-06-13 DIAGNOSIS — J45901 Unspecified asthma with (acute) exacerbation: Secondary | ICD-10-CM

## 2021-06-13 MED ORDER — ALBUTEROL SULFATE HFA 108 (90 BASE) MCG/ACT IN AERS: 2.0000 | INHALATION_SPRAY | Freq: Four times a day (QID) | RESPIRATORY_TRACT | 3 refills | Status: DC | PRN

## 2021-06-13 MED ORDER — ALBUTEROL SULFATE (2.5 MG/3ML) 0.083% IN NEBU: 2.5000 mg | INHALATION_SOLUTION | Freq: Four times a day (QID) | RESPIRATORY_TRACT | 1 refills | Status: AC | PRN

## 2021-08-16 ENCOUNTER — Other Ambulatory Visit: Payer: Self-pay

## 2021-08-16 MED ORDER — LISINOPRIL-HYDROCHLOROTHIAZIDE 20-12.5 MG PO TABS: 1.0000 | ORAL_TABLET | Freq: Every day | ORAL | 0 refills | Status: DC

## 2021-09-20 ENCOUNTER — Other Ambulatory Visit: Payer: Self-pay

## 2021-09-20 DIAGNOSIS — J45901 Unspecified asthma with (acute) exacerbation: Secondary | ICD-10-CM

## 2021-09-20 MED ORDER — LISINOPRIL-HYDROCHLOROTHIAZIDE 20-12.5 MG PO TABS: 1.0000 | ORAL_TABLET | Freq: Every day | ORAL | 1 refills | Status: AC

## 2021-09-20 MED ORDER — ALBUTEROL SULFATE HFA 108 (90 BASE) MCG/ACT IN AERS
2.0000 | INHALATION_SPRAY | Freq: Four times a day (QID) | RESPIRATORY_TRACT | 3 refills | Status: AC | PRN
Start: 2021-09-20 — End: ?

## 2021-09-21 ENCOUNTER — Ambulatory Visit (INDEPENDENT_AMBULATORY_CARE_PROVIDER_SITE_OTHER): Payer: Commercial Managed Care - PPO | Admitting: Family Medicine

## 2021-09-21 ENCOUNTER — Other Ambulatory Visit: Payer: Self-pay

## 2021-09-21 ENCOUNTER — Encounter: Payer: Self-pay | Admitting: Family Medicine

## 2021-09-21 VITALS — BP 123/78 | HR 64 | Temp 98.2°F | Ht 74.0 in | Wt 270.0 lb

## 2021-09-21 DIAGNOSIS — E1165 Type 2 diabetes mellitus with hyperglycemia: Secondary | ICD-10-CM

## 2021-09-21 DIAGNOSIS — J4540 Moderate persistent asthma, uncomplicated: Secondary | ICD-10-CM

## 2021-09-21 DIAGNOSIS — Z125 Encounter for screening for malignant neoplasm of prostate: Secondary | ICD-10-CM

## 2021-09-21 DIAGNOSIS — I10 Essential (primary) hypertension: Secondary | ICD-10-CM

## 2021-09-21 LAB — POCT GLYCOSYLATED HEMOGLOBIN (HGB A1C): Hemoglobin A1C: 7 % — AB (ref 4.0–5.6)

## 2021-09-21 NOTE — Assessment & Plan Note (Addendum)
Continues to have dyspnea however has not been using controller medication.  We discussed recommended daily use of this for best control of asthma symptoms. ?

## 2021-09-21 NOTE — Progress Notes (Signed)
?Eddie Glass - 56 y.o. male MRN [id number]  Date of birth: Nov 14, 1965 ? ?Subjective ?Chief Complaint  ?Patient presents with  ? Follow-up  ? Hypertension  ? Diabetes  ? ? ?HPI ?Eddie Glass is a 55 year old male here today for follow-up visit.  Doing okay at this time.  Continues to have some dyspnea.  He is not using Advair regularly.  Reports that he forgets to take this. ? ?Continues to do well with lisinopril/hydrochlorothiazide for management of hypertension.  Blood pressure is well controlled at this time.  He denies chest pain, shortness of breath, palpitations, headaches or vision changes. ? ?Blood sugars have slightly worse lately since last visit with A1c up to 7.1%.  He denies any symptoms related to diabetes.  He does not want to add medications to help with this at this time. ? ?ROS:  A comprehensive ROS was completed and negative except as noted per HPI ? ?Allergies  ?Allergen Reactions  ? Other Anaphylaxis  ?  Cats  ? ? ?Past Medical History:  ?Diagnosis Date  ? Allergy   ? seasonal  ? Anemia   ? Asthma   ? COVID-19 08/31/2019  ? Hyperlipidemia 07/01/2018  ? ? ?History reviewed. No pertinent surgical history. ? ?Social History  ? ?Socioeconomic History  ? Marital status: Single  ?  Spouse name: Not on file  ? Number of children: 1  ? Years of education: Not on file  ? Highest education level: Not on file  ?Occupational History  ? Occupation: [employer] laboratory technician  ?Tobacco Use  ? Smoking status: Never  ? Smokeless tobacco: Never  ?Vaping Use  ? Vaping Use: Never used  ?Substance and Sexual Activity  ? Alcohol use: Yes  ?  Alcohol/week: 5.0 standard drinks  ?  Types: 5 Standard drinks or equivalent per week  ?  Comment: 0-3 times a week, social, 2-3 drinks of everything, burbon club  ? Drug use: Never  ? Sexual activity: Not Currently  ?  Partners: Female  ?Other Topics Concern  ? Not on file  ?Social History Narrative  ? Not on file  ? ?Social Determinants of Health  ? ?Financial Resource Strain: Not on file  ?Food  Insecurity: Not on file  ?Transportation Needs: Not on file  ?Physical Activity: Not on file  ?Stress: Not on file  ?Social Connections: Not on file  ? ? ?Family History  ?Problem Relation Age of Onset  ? Diabetes Mother   ? Cancer Father   ?     lung cancer  ? Colon cancer Neg Hx   ? Esophageal cancer Neg Hx   ? Rectal cancer Neg Hx   ? Stomach cancer Neg Hx   ? ? ?Health Maintenance  ?Topic Date Due  ? OPHTHALMOLOGY EXAM  Never done  ? Hepatitis C Screening  Never done  ? COVID-19 Vaccine (3 - Booster for Moderna series) 07/22/2020  ? INFLUENZA VACCINE  10/13/2021 (Originally 02/13/2021)  ? HEMOGLOBIN A1C  03/24/2022  ? FOOT EXAM  09/22/2022  ? COLONOSCOPY (Pts 45-39yr Insurance coverage will need to be confirmed)  03/20/2023  ? TETANUS/TDAP  02/13/2028  ? HIV Screening  Completed  ? Zoster Vaccines- Shingrix  Completed  ? HPV VACCINES  Aged Out  ? ? ? ?----------------------------------------------------------------------------------------------------------------------------------------------------------------------------------------------------------------- ?Physical Exam ?BP 123/78 (BP Location: Left Arm, Patient Position: Sitting, Cuff Size: Large)   Pulse 64   Temp 98.2 ?F (36.8 ?C)   Ht '6\' 2"'$  (1.88 m)   Wt 270 lb (122.5 kg)  SpO2 98%   BMI 34.67 kg/m?  ? ?Physical Exam ?Constitutional:   ?   Appearance: Normal appearance.  ?Eyes:  ?   General: No scleral icterus. ?Cardiovascular:  ?   Rate and Rhythm: Normal rate and regular rhythm.  ?Musculoskeletal:  ?   Cervical back: Neck supple.  ?Neurological:  ?   General: No focal deficit present.  ?   Mental Status: He is alert.  ?Psychiatric:     ?   Mood and Affect: Mood normal.     ?   Behavior: Behavior normal.  ? ? ?------------------------------------------------------------------------------------------------------------------------------------------------------------------------------------------------------------------- ?Assessment and  Plan ? ?Essential hypertension ?Blood pressure remains well controlled with current medications.  No changes recommended at this time. ? ?Moderate persistent asthma ?Continues to have dyspnea however has not been using controller medication.  We discussed recommended daily use of this for best control of asthma symptoms. ? ?Type 2 diabetes mellitus with hyperglycemia ([place]) ?Slight worsening of blood sugar since last visit.  He does not want to add medication at this time.  Discussed working on dietary and activity changes to help with blood sugars. ? ? ?No orders of the defined types were placed in this encounter. ? ? ?Return in about 6 months (around 03/24/2022) for HTN/T2DM. ? ? ? ?This visit occurred during the SARS-CoV-2 public health emergency.  Safety protocols were in place, including screening questions prior to the visit, additional usage of staff PPE, and extensive cleaning of exam room while observing appropriate contact time as indicated for disinfecting solutions.  ? ?

## 2021-09-21 NOTE — Patient Instructions (Signed)
Great to see you today! ?Continue current medications.  ?Stop back to have labs completed when fasting.  ?Work on reducing carbohydrates and sugar intake.  ?See me again in 6 months or sooner if needed.  ?

## 2021-09-21 NOTE — Assessment & Plan Note (Signed)
Slight worsening of blood sugar since last visit.  He does not want to add medication at this time.  Discussed working on dietary and activity changes to help with blood sugars. ?

## 2021-09-21 NOTE — Assessment & Plan Note (Signed)
Blood pressure remains well controlled with current medications.  No changes recommended at this time. ?

## 2021-09-22 LAB — CBC WITH DIFFERENTIAL/PLATELET
Absolute Monocytes: 428 cells/uL (ref 200–950)
Basophils Absolute: 48 cells/uL (ref 0–200)
Basophils Relative: 0.7 %
Eosinophils Absolute: 186 cells/uL (ref 15–500)
Eosinophils Relative: 2.7 %
HCT: 41.1 % (ref 38.5–50.0)
Hemoglobin: 14 g/dL (ref 13.2–17.1)
Lymphs Abs: 2008 cells/uL (ref 850–3900)
MCH: 26.5 pg — ABNORMAL LOW (ref 27.0–33.0)
MCHC: 34.1 g/dL (ref 32.0–36.0)
MCV: 77.8 fL — ABNORMAL LOW (ref 80.0–100.0)
MPV: 9.4 fL (ref 7.5–12.5)
Monocytes Relative: 6.2 %
Neutro Abs: 4230 cells/uL (ref 1500–7800)
Neutrophils Relative %: 61.3 %
Platelets: 351 10*3/uL (ref 140–400)
RBC: 5.28 10*6/uL (ref 4.20–5.80)
RDW: 14.8 % (ref 11.0–15.0)
Total Lymphocyte: 29.1 %
WBC: 6.9 10*3/uL (ref 3.8–10.8)

## 2021-09-22 LAB — COMPLETE METABOLIC PANEL WITH GFR
AG Ratio: 1.3 (calc) (ref 1.0–2.5)
ALT: 12 U/L (ref 9–46)
AST: 22 U/L (ref 10–35)
Albumin: 4.1 g/dL (ref 3.6–5.1)
Alkaline phosphatase (APISO): 67 U/L (ref 35–144)
BUN: 19 mg/dL (ref 7–25)
CO2: 28 mmol/L (ref 20–32)
Calcium: 9.4 mg/dL (ref 8.6–10.3)
Chloride: 103 mmol/L (ref 98–110)
Creat: 1.23 mg/dL (ref 0.70–1.30)
Globulin: 3.2 g/dL (calc) (ref 1.9–3.7)
Glucose, Bld: 139 mg/dL (ref 65–139)
Potassium: 4.3 mmol/L (ref 3.5–5.3)
Sodium: 138 mmol/L (ref 135–146)
Total Bilirubin: 0.3 mg/dL (ref 0.2–1.2)
Total Protein: 7.3 g/dL (ref 6.1–8.1)
eGFR: 69 mL/min/{1.73_m2} (ref >=60)

## 2021-09-22 LAB — LIPID PANEL W/REFLEX DIRECT LDL
Cholesterol: 170 mg/dL (ref <200)
HDL: 46 mg/dL (ref >=40)
LDL Cholesterol (Calc): 101 mg/dL (calc) — ABNORMAL HIGH
Non-HDL Cholesterol (Calc): 124 mg/dL (calc) (ref <130)
Total CHOL/HDL Ratio: 3.7 (calc) (ref <5.0)
Triglycerides: 132 mg/dL (ref <150)

## 2021-09-22 LAB — PSA: PSA: 0.3 ng/mL (ref <=4.00)

## 2021-10-04 ENCOUNTER — Encounter (HOSPITAL_COMMUNITY): Payer: Self-pay

## 2021-10-04 ENCOUNTER — Inpatient Hospital Stay (HOSPITAL_COMMUNITY): Payer: Commercial Managed Care - PPO

## 2021-10-04 ENCOUNTER — Emergency Department (HOSPITAL_COMMUNITY): Payer: Commercial Managed Care - PPO

## 2021-10-04 ENCOUNTER — Encounter (HOSPITAL_COMMUNITY): Payer: Commercial Managed Care - PPO

## 2021-10-04 ENCOUNTER — Inpatient Hospital Stay (HOSPITAL_COMMUNITY)
Admission: EM | Admit: 2021-10-04 | Discharge: 2021-10-14 | DRG: 208 | Disposition: E | Payer: Commercial Managed Care - PPO | Attending: Internal Medicine | Admitting: Internal Medicine

## 2021-10-04 DIAGNOSIS — G931 Anoxic brain damage, not elsewhere classified: Secondary | ICD-10-CM | POA: Diagnosis present

## 2021-10-04 DIAGNOSIS — N179 Acute kidney failure, unspecified: Secondary | ICD-10-CM | POA: Diagnosis present

## 2021-10-04 DIAGNOSIS — J9601 Acute respiratory failure with hypoxia: Secondary | ICD-10-CM | POA: Diagnosis present

## 2021-10-04 DIAGNOSIS — J9602 Acute respiratory failure with hypercapnia: Secondary | ICD-10-CM | POA: Diagnosis not present

## 2021-10-04 DIAGNOSIS — E874 Mixed disorder of acid-base balance: Secondary | ICD-10-CM | POA: Diagnosis present

## 2021-10-04 DIAGNOSIS — I959 Hypotension, unspecified: Secondary | ICD-10-CM | POA: Diagnosis present

## 2021-10-04 DIAGNOSIS — K7200 Acute and subacute hepatic failure without coma: Secondary | ICD-10-CM | POA: Diagnosis present

## 2021-10-04 DIAGNOSIS — I21A1 Myocardial infarction type 2: Secondary | ICD-10-CM | POA: Diagnosis present

## 2021-10-04 DIAGNOSIS — G936 Cerebral edema: Secondary | ICD-10-CM | POA: Diagnosis not present

## 2021-10-04 DIAGNOSIS — G935 Compression of brain: Secondary | ICD-10-CM | POA: Diagnosis not present

## 2021-10-04 DIAGNOSIS — E872 Acidosis, unspecified: Secondary | ICD-10-CM

## 2021-10-04 DIAGNOSIS — J4551 Severe persistent asthma with (acute) exacerbation: Secondary | ICD-10-CM | POA: Diagnosis not present

## 2021-10-04 DIAGNOSIS — J4552 Severe persistent asthma with status asthmaticus: Principal | ICD-10-CM | POA: Diagnosis present

## 2021-10-04 DIAGNOSIS — E669 Obesity, unspecified: Secondary | ICD-10-CM | POA: Diagnosis present

## 2021-10-04 DIAGNOSIS — E1165 Type 2 diabetes mellitus with hyperglycemia: Secondary | ICD-10-CM | POA: Diagnosis present

## 2021-10-04 DIAGNOSIS — I10 Essential (primary) hypertension: Secondary | ICD-10-CM | POA: Diagnosis present

## 2021-10-04 DIAGNOSIS — R0602 Shortness of breath: Secondary | ICD-10-CM | POA: Diagnosis present

## 2021-10-04 DIAGNOSIS — Z66 Do not resuscitate: Secondary | ICD-10-CM | POA: Diagnosis present

## 2021-10-04 DIAGNOSIS — M96A3 Multiple fractures of ribs associated with chest compression and cardiopulmonary resuscitation: Secondary | ICD-10-CM | POA: Diagnosis present

## 2021-10-04 DIAGNOSIS — I468 Cardiac arrest due to other underlying condition: Secondary | ICD-10-CM | POA: Diagnosis present

## 2021-10-04 DIAGNOSIS — G9382 Brain death: Secondary | ICD-10-CM | POA: Diagnosis not present

## 2021-10-04 DIAGNOSIS — Z7951 Long term (current) use of inhaled steroids: Secondary | ICD-10-CM | POA: Diagnosis not present

## 2021-10-04 DIAGNOSIS — S0081XA Abrasion of other part of head, initial encounter: Secondary | ICD-10-CM | POA: Diagnosis present

## 2021-10-04 DIAGNOSIS — Z79899 Other long term (current) drug therapy: Secondary | ICD-10-CM

## 2021-10-04 DIAGNOSIS — Z6834 Body mass index (BMI) 34.0-34.9, adult: Secondary | ICD-10-CM

## 2021-10-04 DIAGNOSIS — I469 Cardiac arrest, cause unspecified: Secondary | ICD-10-CM | POA: Diagnosis not present

## 2021-10-04 DIAGNOSIS — Z20822 Contact with and (suspected) exposure to covid-19: Secondary | ICD-10-CM | POA: Diagnosis present

## 2021-10-04 DIAGNOSIS — Z833 Family history of diabetes mellitus: Secondary | ICD-10-CM

## 2021-10-04 DIAGNOSIS — E785 Hyperlipidemia, unspecified: Secondary | ICD-10-CM | POA: Diagnosis present

## 2021-10-04 DIAGNOSIS — D684 Acquired coagulation factor deficiency: Secondary | ICD-10-CM | POA: Diagnosis present

## 2021-10-04 DIAGNOSIS — Z8616 Personal history of COVID-19: Secondary | ICD-10-CM

## 2021-10-04 DIAGNOSIS — I4891 Unspecified atrial fibrillation: Secondary | ICD-10-CM | POA: Diagnosis not present

## 2021-10-04 DIAGNOSIS — R092 Respiratory arrest: Secondary | ICD-10-CM | POA: Diagnosis not present

## 2021-10-04 DIAGNOSIS — Z801 Family history of malignant neoplasm of trachea, bronchus and lung: Secondary | ICD-10-CM

## 2021-10-04 LAB — COMPREHENSIVE METABOLIC PANEL
ALT: 20 U/L (ref 0–44)
AST: 48 U/L — ABNORMAL HIGH (ref 15–41)
Albumin: 3.4 g/dL — ABNORMAL LOW (ref 3.5–5.0)
Alkaline Phosphatase: 71 U/L (ref 38–126)
Anion gap: 26 — ABNORMAL HIGH (ref 5–15)
BUN: 16 mg/dL (ref 6–20)
CO2: 11 mmol/L — ABNORMAL LOW (ref 22–32)
Calcium: 9.5 mg/dL (ref 8.9–10.3)
Chloride: 105 mmol/L (ref 98–111)
Creatinine, Ser: 2.05 mg/dL — ABNORMAL HIGH (ref 0.61–1.24)
GFR, Estimated: 38 mL/min — ABNORMAL LOW (ref >60)
Glucose, Bld: 301 mg/dL — ABNORMAL HIGH (ref 70–99)
Potassium: 5.1 mmol/L (ref 3.5–5.1)
Sodium: 142 mmol/L (ref 135–145)
Total Bilirubin: 0.4 mg/dL (ref 0.3–1.2)
Total Protein: 6.6 g/dL (ref 6.5–8.1)

## 2021-10-04 LAB — POCT I-STAT 7, (LYTES, BLD GAS, ICA,H+H)
Acid-base deficit: 12 mmol/L — ABNORMAL HIGH (ref 0.0–2.0)
Acid-base deficit: 13 mmol/L — ABNORMAL HIGH (ref 0.0–2.0)
Acid-base deficit: 12 mmol/L — ABNORMAL HIGH (ref 0.0–2.0)
Bicarbonate: 21.4 mmol/L (ref 20.0–28.0)
Bicarbonate: 19.1 mmol/L — ABNORMAL LOW (ref 20.0–28.0)
Bicarbonate: 18.7 mmol/L — ABNORMAL LOW (ref 20.0–28.0)
Calcium, Ion: 1.19 mmol/L (ref 1.15–1.40)
Calcium, Ion: 1.01 mmol/L — ABNORMAL LOW (ref 1.15–1.40)
Calcium, Ion: 0.9 mmol/L — ABNORMAL LOW (ref 1.15–1.40)
HCT: 47 % (ref 39.0–52.0)
HCT: 42 % (ref 39.0–52.0)
HCT: 40 % (ref 39.0–52.0)
Hemoglobin: 16 g/dL (ref 13.0–17.0)
Hemoglobin: 14.3 g/dL (ref 13.0–17.0)
Hemoglobin: 13.6 g/dL (ref 13.0–17.0)
O2 Saturation: 99 %
O2 Saturation: 94 %
O2 Saturation: 98 %
Patient temperature: 97.2
Patient temperature: 37.4
Patient temperature: 100
Potassium: 5.4 mmol/L — ABNORMAL HIGH (ref 3.5–5.1)
Potassium: 5.1 mmol/L (ref 3.5–5.1)
Potassium: 4.9 mmol/L (ref 3.5–5.1)
Sodium: 148 mmol/L — ABNORMAL HIGH (ref 135–145)
Sodium: 141 mmol/L (ref 135–145)
Sodium: 141 mmol/L (ref 135–145)
TCO2: 24 mmol/L (ref 22–32)
TCO2: 21 mmol/L — ABNORMAL LOW (ref 22–32)
TCO2: 21 mmol/L — ABNORMAL LOW (ref 22–32)
pCO2 arterial: 84.3 mmHg (ref 32–48)
pCO2 arterial: 73.3 mmHg (ref 32–48)
pCO2 arterial: 66.2 mmHg (ref 32–48)
pH, Arterial: 7.008 — CL (ref 7.35–7.45)
pH, Arterial: 7.027 — CL (ref 7.35–7.45)
pH, Arterial: 7.065 — CL (ref 7.35–7.45)
pO2, Arterial: 204 mmHg — ABNORMAL HIGH (ref 83–108)
pO2, Arterial: 104 mmHg (ref 83–108)
pO2, Arterial: 144 mmHg — ABNORMAL HIGH (ref 83–108)

## 2021-10-04 LAB — BLOOD GAS, ARTERIAL
Drawn by: 137461
O2 Saturation: 96.2 %
Patient temperature: 36
pH, Arterial: <6.95 — CL (ref 7.35–7.45)
pO2, Arterial: 113 mmHg — ABNORMAL HIGH (ref 83–108)

## 2021-10-04 LAB — MRSA NEXT GEN BY PCR, NASAL: MRSA by PCR Next Gen: NOT DETECTED

## 2021-10-04 LAB — PROTIME-INR
INR: 1.8 — ABNORMAL HIGH (ref 0.8–1.2)
Prothrombin Time: 21.2 seconds — ABNORMAL HIGH (ref 11.4–15.2)

## 2021-10-04 LAB — CBC WITH DIFFERENTIAL/PLATELET
Abs Immature Granulocytes: 0.65 10*3/uL — ABNORMAL HIGH (ref 0.00–0.07)
Basophils Absolute: 0.1 10*3/uL (ref 0.0–0.1)
Basophils Relative: 1 %
Eosinophils Absolute: 0.2 10*3/uL (ref 0.0–0.5)
Eosinophils Relative: 2 %
HCT: 44.1 % (ref 39.0–52.0)
Hemoglobin: 13.4 g/dL (ref 13.0–17.0)
Immature Granulocytes: 7 %
Lymphocytes Relative: 52 %
Lymphs Abs: 5.1 10*3/uL — ABNORMAL HIGH (ref 0.7–4.0)
MCH: 26.6 pg (ref 26.0–34.0)
MCHC: 30.4 g/dL (ref 30.0–36.0)
MCV: 87.7 fL (ref 80.0–100.0)
Monocytes Absolute: 0.5 10*3/uL (ref 0.1–1.0)
Monocytes Relative: 5 %
Neutro Abs: 3.1 10*3/uL (ref 1.7–7.7)
Neutrophils Relative %: 33 %
Platelets: 271 10*3/uL (ref 150–400)
RBC: 5.03 MIL/uL (ref 4.22–5.81)
RDW: 15 % (ref 11.5–15.5)
WBC: 9.5 10*3/uL (ref 4.0–10.5)
nRBC: 0.5 % — ABNORMAL HIGH (ref 0.0–0.2)

## 2021-10-04 LAB — I-STAT VENOUS BLOOD GAS, ED
Acid-base deficit: 26 mmol/L — ABNORMAL HIGH (ref 0.0–2.0)
Bicarbonate: 11 mmol/L — ABNORMAL LOW (ref 20.0–28.0)
Calcium, Ion: 1.15 mmol/L (ref 1.15–1.40)
HCT: 41 % (ref 39.0–52.0)
Hemoglobin: 13.9 g/dL (ref 13.0–17.0)
O2 Saturation: 83 %
Potassium: 5 mmol/L (ref 3.5–5.1)
Sodium: 139 mmol/L (ref 135–145)
TCO2: 14 mmol/L — ABNORMAL LOW (ref 22–32)
pCO2, Ven: 86 mmHg (ref 44–60)
pH, Ven: 6.715 — CL (ref 7.25–7.43)
pO2, Ven: 96 mmHg — ABNORMAL HIGH (ref 32–45)

## 2021-10-04 LAB — ECHOCARDIOGRAM COMPLETE
Area-P 1/2: 2.71 cm2
Height: 74 in
S' Lateral: 2.3 cm
Weight: 4303.38 oz

## 2021-10-04 LAB — APTT: aPTT: 59 seconds — ABNORMAL HIGH (ref 24–36)

## 2021-10-04 LAB — HEPARIN LEVEL (UNFRACTIONATED): Heparin Unfractionated: 0.23 IU/mL — ABNORMAL LOW (ref 0.30–0.70)

## 2021-10-04 LAB — RESP PANEL BY RT-PCR (FLU A&B, COVID) ARPGX2
Influenza A by PCR: NEGATIVE
Influenza B by PCR: NEGATIVE
SARS Coronavirus 2 by RT PCR: NEGATIVE

## 2021-10-04 LAB — I-STAT CHEM 8, ED
BUN: 19 mg/dL (ref 6–20)
Calcium, Ion: 1.15 mmol/L (ref 1.15–1.40)
Chloride: 111 mmol/L (ref 98–111)
Creatinine, Ser: 1.6 mg/dL — ABNORMAL HIGH (ref 0.61–1.24)
Glucose, Bld: 270 mg/dL — ABNORMAL HIGH (ref 70–99)
HCT: 42 % (ref 39.0–52.0)
Hemoglobin: 14.3 g/dL (ref 13.0–17.0)
Potassium: 5 mmol/L (ref 3.5–5.1)
Sodium: 140 mmol/L (ref 135–145)
TCO2: 16 mmol/L — ABNORMAL LOW (ref 22–32)

## 2021-10-04 LAB — GLUCOSE, CAPILLARY
Glucose-Capillary: 238 mg/dL — ABNORMAL HIGH (ref 70–99)
Glucose-Capillary: 238 mg/dL — ABNORMAL HIGH (ref 70–99)
Glucose-Capillary: 148 mg/dL — ABNORMAL HIGH (ref 70–99)
Glucose-Capillary: 120 mg/dL — ABNORMAL HIGH (ref 70–99)
Glucose-Capillary: 115 mg/dL — ABNORMAL HIGH (ref 70–99)

## 2021-10-04 LAB — TROPONIN I (HIGH SENSITIVITY)
Troponin I (High Sensitivity): 60 ng/L — ABNORMAL HIGH (ref <18)
Troponin I (High Sensitivity): 4500 ng/L (ref <18)

## 2021-10-04 LAB — HIV ANTIBODY (ROUTINE TESTING W REFLEX): HIV Screen 4th Generation wRfx: NONREACTIVE

## 2021-10-04 LAB — LACTIC ACID, PLASMA
Lactic Acid, Venous: >9 mmol/L (ref 0.5–1.9)
Lactic Acid, Venous: 3.2 mmol/L (ref 0.5–1.9)

## 2021-10-04 LAB — ETHANOL: Alcohol, Ethyl (B): <10 mg/dL (ref <10)

## 2021-10-04 LAB — CBG MONITORING, ED: Glucose-Capillary: 294 mg/dL — ABNORMAL HIGH (ref 70–99)

## 2021-10-04 LAB — BRAIN NATRIURETIC PEPTIDE: B Natriuretic Peptide: 87 pg/mL (ref 0.0–100.0)

## 2021-10-04 LAB — MAGNESIUM: Magnesium: 4.1 mg/dL — ABNORMAL HIGH (ref 1.7–2.4)

## 2021-10-04 MED ORDER — HEPARIN SODIUM (PORCINE) 5000 UNIT/ML IJ SOLN
INTRAMUSCULAR | Status: AC
  Administered 2021-10-04: 4000 [IU] via INTRAVENOUS
  Filled 2021-10-04: qty 1

## 2021-10-04 MED ORDER — FENTANYL CITRATE PF 50 MCG/ML IJ SOSY: 50.0000 ug | PREFILLED_SYRINGE | Freq: Once | INTRAMUSCULAR | Status: DC

## 2021-10-04 MED ORDER — PERFLUTREN LIPID MICROSPHERE
1.0000 mL | INTRAVENOUS | Status: AC | PRN
  Administered 2021-10-04: 4 mL via INTRAVENOUS
  Filled 2021-10-04: qty 10

## 2021-10-04 MED ORDER — ALBUTEROL SULFATE (2.5 MG/3ML) 0.083% IN NEBU
INHALATION_SOLUTION | RESPIRATORY_TRACT | Status: AC
  Filled 2021-10-04: qty 96

## 2021-10-04 MED ORDER — METHYLPREDNISOLONE SODIUM SUCC 125 MG IJ SOLR
125.0000 mg | INTRAMUSCULAR | Status: AC
  Administered 2021-10-04: 125 mg via INTRAVENOUS
  Filled 2021-10-04: qty 2

## 2021-10-04 MED ORDER — SODIUM BICARBONATE 8.4 % IV SOLN
100.0000 meq | Freq: Once | INTRAVENOUS | Status: AC
  Administered 2021-10-04: 100 meq via INTRAVENOUS
  Filled 2021-10-04: qty 100

## 2021-10-04 MED ORDER — HEPARIN SODIUM (PORCINE) 5000 UNIT/ML IJ SOLN
5000.0000 [IU] | Freq: Three times a day (TID) | INTRAMUSCULAR | Status: DC
  Filled 2021-10-04: qty 1

## 2021-10-04 MED ORDER — ALBUTEROL SULFATE (2.5 MG/3ML) 0.083% IN NEBU
10.0000 mg | INHALATION_SOLUTION | Freq: Once | RESPIRATORY_TRACT | Status: AC
  Administered 2021-10-04: 10 mg via RESPIRATORY_TRACT

## 2021-10-04 MED ORDER — ALBUTEROL SULFATE (2.5 MG/3ML) 0.083% IN NEBU: 10.0000 mg | INHALATION_SOLUTION | Freq: Once | RESPIRATORY_TRACT | Status: AC

## 2021-10-04 MED ORDER — SODIUM BICARBONATE 8.4 % IV SOLN
200.0000 meq | Freq: Once | INTRAVENOUS | Status: AC
  Administered 2021-10-04: 200 meq via INTRAVENOUS

## 2021-10-04 MED ORDER — CHLORHEXIDINE GLUCONATE 0.12% ORAL RINSE (MEDLINE KIT)
15.0000 mL | Freq: Two times a day (BID) | OROMUCOSAL | Status: DC
  Administered 2021-10-04 – 2021-10-07 (×6): 15 mL via OROMUCOSAL

## 2021-10-04 MED ORDER — ARTIFICIAL TEARS OPHTHALMIC OINT
1.0000 "application " | TOPICAL_OINTMENT | Freq: Three times a day (TID) | OPHTHALMIC | Status: DC
  Administered 2021-10-04: 1 via OPHTHALMIC
  Filled 2021-10-04: qty 3.5

## 2021-10-04 MED ORDER — BUDESONIDE 0.5 MG/2ML IN SUSP
0.5000 mg | Freq: Two times a day (BID) | RESPIRATORY_TRACT | Status: DC
  Administered 2021-10-04 – 2021-10-07 (×7): 0.5 mg via RESPIRATORY_TRACT
  Filled 2021-10-04 (×7): qty 2

## 2021-10-04 MED ORDER — VASOPRESSIN 20 UNITS/100 ML INFUSION FOR SHOCK
INTRAVENOUS | Status: AC
  Filled 2021-10-04: qty 100

## 2021-10-04 MED ORDER — SODIUM CHLORIDE 0.9 % IV SOLN
0.0000 ug/kg/min | INTRAVENOUS | Status: DC
  Filled 2021-10-04: qty 20

## 2021-10-04 MED ORDER — EPINEPHRINE 1 MG/10ML IJ SOSY
PREFILLED_SYRINGE | INTRAMUSCULAR | Status: DC | PRN
  Administered 2021-10-04: 1 mg via INTRAVENOUS

## 2021-10-04 MED ORDER — ACETAMINOPHEN 325 MG PO TABS: 650.0000 mg | ORAL_TABLET | ORAL | Status: DC

## 2021-10-04 MED ORDER — ARFORMOTEROL TARTRATE 15 MCG/2ML IN NEBU
15.0000 ug | INHALATION_SOLUTION | Freq: Two times a day (BID) | RESPIRATORY_TRACT | Status: DC
  Administered 2021-10-04 – 2021-10-07 (×5): 15 ug via RESPIRATORY_TRACT
  Filled 2021-10-04 (×8): qty 2

## 2021-10-04 MED ORDER — CISATRACURIUM BOLUS VIA INFUSION
0.0000 mg | Freq: Once | INTRAVENOUS | Status: DC
  Filled 2021-10-04: qty 5

## 2021-10-04 MED ORDER — VASOPRESSIN 20 UNITS/100 ML INFUSION FOR SHOCK
0.0300 [IU]/min | INTRAVENOUS | Status: DC
  Administered 2021-10-04 – 2021-10-07 (×6): 0.03 [IU]/min via INTRAVENOUS
  Filled 2021-10-04 (×6): qty 100

## 2021-10-04 MED ORDER — DOCUSATE SODIUM 50 MG/5ML PO LIQD: 100.0000 mg | Freq: Two times a day (BID) | ORAL | Status: DC | PRN

## 2021-10-04 MED ORDER — CHLORHEXIDINE GLUCONATE CLOTH 2 % EX PADS
6.0000 | MEDICATED_PAD | Freq: Every day | CUTANEOUS | Status: DC
  Administered 2021-10-04 – 2021-10-07 (×4): 6 via TOPICAL

## 2021-10-04 MED ORDER — FENTANYL 2500MCG IN NS 250ML (10MCG/ML) PREMIX INFUSION
50.0000 ug/h | INTRAVENOUS | Status: DC
  Administered 2021-10-05: 100 ug/h via INTRAVENOUS
  Filled 2021-10-04: qty 250

## 2021-10-04 MED ORDER — NOREPINEPHRINE 16 MG/250ML-% IV SOLN
0.0000 ug/min | INTRAVENOUS | Status: DC
  Administered 2021-10-04: 70 ug/min via INTRAVENOUS
  Administered 2021-10-04: 10 ug/min via INTRAVENOUS
  Administered 2021-10-05 (×6): 80 ug/min via INTRAVENOUS
  Administered 2021-10-06: 30 ug/min via INTRAVENOUS
  Administered 2021-10-06: 24 ug/min via INTRAVENOUS
  Administered 2021-10-06: 50 ug/min via INTRAVENOUS
  Administered 2021-10-06: 64 ug/min via INTRAVENOUS
  Filled 2021-10-04 (×12): qty 250

## 2021-10-04 MED ORDER — PANTOPRAZOLE SODIUM 40 MG IV SOLR
40.0000 mg | Freq: Every day | INTRAVENOUS | Status: DC
  Administered 2021-10-04: 40 mg via INTRAVENOUS
  Filled 2021-10-04: qty 10

## 2021-10-04 MED ORDER — ALBUTEROL SULFATE (2.5 MG/3ML) 0.083% IN NEBU
INHALATION_SOLUTION | RESPIRATORY_TRACT | Status: AC
  Filled 2021-10-04: qty 12

## 2021-10-04 MED ORDER — LACTATED RINGERS IV BOLUS
1000.0000 mL | Freq: Once | INTRAVENOUS | Status: AC
  Administered 2021-10-04: 1000 mL via INTRAVENOUS

## 2021-10-04 MED ORDER — ROCURONIUM BROMIDE 50 MG/5ML IV SOLN
INTRAVENOUS | Status: DC | PRN
  Administered 2021-10-04: 100 mg via INTRAVENOUS

## 2021-10-04 MED ORDER — MAGNESIUM SULFATE 2 GM/50ML IV SOLN
2.0000 g | Freq: Once | INTRAVENOUS | Status: AC
  Administered 2021-10-04: 2 g via INTRAVENOUS

## 2021-10-04 MED ORDER — HEPARIN (PORCINE) 25000 UT/250ML-% IV SOLN
1500.0000 [IU]/h | INTRAVENOUS | Status: DC
  Administered 2021-10-04: 1400 [IU]/h via INTRAVENOUS
  Administered 2021-10-05: 1600 [IU]/h via INTRAVENOUS
  Filled 2021-10-04 (×2): qty 250

## 2021-10-04 MED ORDER — POLYETHYLENE GLYCOL 3350 17 G PO PACK: 17.0000 g | PACK | Freq: Every day | ORAL | Status: DC | PRN

## 2021-10-04 MED ORDER — INSULIN ASPART 100 UNIT/ML IJ SOLN
0.0000 [IU] | INTRAMUSCULAR | Status: DC
  Administered 2021-10-04: 7 [IU] via SUBCUTANEOUS
  Administered 2021-10-04: 3 [IU] via SUBCUTANEOUS
  Administered 2021-10-05 (×2): 4 [IU] via SUBCUTANEOUS
  Administered 2021-10-05 – 2021-10-06 (×6): 7 [IU] via SUBCUTANEOUS

## 2021-10-04 MED ORDER — ALBUTEROL SULFATE (2.5 MG/3ML) 0.083% IN NEBU
20.0000 mg | INHALATION_SOLUTION | Freq: Once | RESPIRATORY_TRACT | Status: DC
Start: 2021-10-04 — End: 2021-10-04

## 2021-10-04 MED ORDER — BUSPIRONE HCL 15 MG PO TABS
30.0000 mg | ORAL_TABLET | Freq: Three times a day (TID) | ORAL | Status: DC
  Administered 2021-10-04 – 2021-10-06 (×5): 30 mg
  Filled 2021-10-04 (×5): qty 2

## 2021-10-04 MED ORDER — ALBUTEROL SULFATE (2.5 MG/3ML) 0.083% IN NEBU
INHALATION_SOLUTION | RESPIRATORY_TRACT | Status: AC
  Administered 2021-10-04: 10 mg via RESPIRATORY_TRACT
  Filled 2021-10-04: qty 12

## 2021-10-04 MED ORDER — FENTANYL BOLUS VIA INFUSION
50.0000 ug | INTRAVENOUS | Status: DC | PRN
  Filled 2021-10-04: qty 50

## 2021-10-04 MED ORDER — ACETAMINOPHEN 160 MG/5ML PO SOLN
650.0000 mg | ORAL | Status: DC
  Administered 2021-10-04 – 2021-10-05 (×4): 650 mg
  Filled 2021-10-04 (×5): qty 20.3

## 2021-10-04 MED ORDER — BUSPIRONE HCL 15 MG PO TABS
30.0000 mg | ORAL_TABLET | Freq: Three times a day (TID) | ORAL | Status: DC
  Filled 2021-10-04: qty 2

## 2021-10-04 MED ORDER — SODIUM BICARBONATE 8.4 % IV SOLN
200.0000 meq | Freq: Once | INTRAVENOUS | Status: AC
  Administered 2021-10-04: 200 meq via INTRAVENOUS
  Filled 2021-10-04: qty 200

## 2021-10-04 MED ORDER — EPINEPHRINE HCL 5 MG/250ML IV SOLN IN NS
0.5000 ug/min | INTRAVENOUS | Status: DC
  Administered 2021-10-04: 5 ug/min via INTRAVENOUS
  Administered 2021-10-04 – 2021-10-05 (×6): 20 ug/min via INTRAVENOUS
  Filled 2021-10-04 (×7): qty 250

## 2021-10-04 MED ORDER — MIDAZOLAM HCL 2 MG/2ML IJ SOLN
2.0000 mg | INTRAMUSCULAR | Status: DC | PRN
  Administered 2021-10-04: 2 mg via INTRAVENOUS

## 2021-10-04 MED ORDER — CALCIUM GLUCONATE-NACL 2-0.675 GM/100ML-% IV SOLN
2.0000 g | Freq: Once | INTRAVENOUS | Status: AC
  Administered 2021-10-05: 2000 mg via INTRAVENOUS
  Filled 2021-10-04: qty 100

## 2021-10-04 MED ORDER — FENTANYL 2500MCG IN NS 250ML (10MCG/ML) PREMIX INFUSION
25.0000 ug/h | INTRAVENOUS | Status: DC
  Administered 2021-10-04: 50 ug/h via INTRAVENOUS
  Filled 2021-10-04: qty 250

## 2021-10-04 MED ORDER — ORAL CARE MOUTH RINSE
15.0000 mL | OROMUCOSAL | Status: DC
  Administered 2021-10-05 – 2021-10-07 (×24): 15 mL via OROMUCOSAL

## 2021-10-04 MED ORDER — ACETAMINOPHEN 650 MG RE SUPP: 650.0000 mg | RECTAL | Status: DC

## 2021-10-04 MED ORDER — IOHEXOL 350 MG/ML SOLN
100.0000 mL | Freq: Once | INTRAVENOUS | Status: AC | PRN
  Administered 2021-10-04: 75 mL via INTRAVENOUS

## 2021-10-04 MED ORDER — METHYLPREDNISOLONE SODIUM SUCC 125 MG IJ SOLR
60.0000 mg | Freq: Two times a day (BID) | INTRAMUSCULAR | Status: DC
  Administered 2021-10-04 – 2021-10-06 (×4): 60 mg via INTRAVENOUS
  Filled 2021-10-04 (×4): qty 2

## 2021-10-04 MED ORDER — STERILE WATER FOR INJECTION IV SOLN
INTRAVENOUS | Status: DC
  Filled 2021-10-04 (×8): qty 1000

## 2021-10-04 MED ORDER — DOCUSATE SODIUM 100 MG PO CAPS: 100.0000 mg | ORAL_CAPSULE | Freq: Two times a day (BID) | ORAL | Status: DC | PRN

## 2021-10-04 MED ORDER — SODIUM BICARBONATE 8.4 % IV SOLN
INTRAVENOUS | Status: AC
  Filled 2021-10-04: qty 200

## 2021-10-04 MED ORDER — PHENYLEPHRINE 40 MCG/ML (10ML) SYRINGE FOR IV PUSH (FOR BLOOD PRESSURE SUPPORT)
PREFILLED_SYRINGE | INTRAVENOUS | Status: AC
  Filled 2021-10-04: qty 10

## 2021-10-04 MED ORDER — HEPARIN SODIUM (PORCINE) 5000 UNIT/ML IJ SOLN: 4000.0000 [IU] | Freq: Once | INTRAMUSCULAR | Status: AC

## 2021-10-04 MED ORDER — ALBUTEROL SULFATE (2.5 MG/3ML) 0.083% IN NEBU
20.0000 mg/h | INHALATION_SOLUTION | RESPIRATORY_TRACT | Status: DC
  Administered 2021-10-04: 20 mg/h via RESPIRATORY_TRACT
  Filled 2021-10-04 (×16): qty 24
  Filled 2021-10-04: qty 3
  Filled 2021-10-04 (×9): qty 24
  Filled 2021-10-04: qty 3
  Filled 2021-10-04 (×15): qty 24

## 2021-10-04 MED ORDER — FENTANYL CITRATE (PF) 100 MCG/2ML IJ SOLN
INTRAMUSCULAR | Status: DC | PRN
  Administered 2021-10-04: 100 ug via INTRAVENOUS

## 2021-10-04 MED ORDER — PROPOFOL 1000 MG/100ML IV EMUL: 25.0000 ug/kg/min | INTRAVENOUS | Status: DC

## 2021-10-04 MED ORDER — MIDAZOLAM HCL 2 MG/2ML IJ SOLN
2.0000 mg | INTRAMUSCULAR | Status: DC | PRN
  Filled 2021-10-04: qty 2

## 2021-10-04 NOTE — Procedures (Signed)
Central Venous Catheter Insertion Procedure Note ? ?Eddie Glass  ?[id number]  ?1966-01-15 ? ?Date:10/13/2021  ?Time:1:33 PM  ? ?Provider Performing:Omelia Marquart Darrick Meigs  ? ?Procedure: Insertion of Non-tunneled Central Venous Catheter(36556) with US guidance (49826)  ? ?Indication(s) ?Medication administration ? ?Consent ?Unable to obtain consent due to emergent nature of procedure. ? ?Anesthesia ?Topical only with 1% lidocaine  ? ?Timeout ?Verified patient identification, verified procedure, site/side was marked, verified correct patient position, special equipment/implants available, medications/allergies/relevant history reviewed, required imaging and test results available. ? ?Sterile Technique ?Maximal sterile technique including full sterile barrier drape, hand hygiene, sterile gown, sterile gloves, mask, hair covering, sterile ultrasound probe cover (if used). ? ?Procedure Description ?Area of catheter insertion was cleaned with chlorhexidine and draped in sterile fashion.  With real-time ultrasound guidance a central venous catheter was placed into the left internal jugular vein. Nonpulsatile blood flow and easy flushing noted in all ports.  The catheter was sutured in place and sterile dressing applied. ? ?Complications/Tolerance ?None; patient tolerated the procedure well. ?Chest X-ray is ordered to verify placement for internal jugular or subclavian cannulation.   Chest x-ray is not ordered for femoral cannulation. ? ?EBL ?Minimal ? ?Specimen(s) ?None ? ?[name], MD ?Internal Medicine Resident PGY-3 ?[facility] ?Pager: [phone] ?10/02/2021 1:34 PM  ?  ?

## 2021-10-04 NOTE — Procedures (Signed)
Arterial Catheter Insertion Procedure Note ? ?Eddie Glass  ?[id number]  ?[birth year] ? ?Date:09/14/2021  ?Time:2:56 PM  ? ? ?Provider Performing: Seiji Wiswell  ? ? ?Procedure: Insertion of Arterial Line [phone]) with US guidance (66599)  ? ?Indication(s) ?Blood pressure monitoring and/or need for frequent ABGs ? ?Consent ?Unable to obtain consent due to emergent nature of procedure. ? ?Anesthesia ?None ? ? ?Time Out ?Verified patient identification, verified procedure, site/side was marked, verified correct patient position, special equipment/implants available, medications/allergies/relevant history reviewed, required imaging and test results available. ? ? ?Sterile Technique ?Maximal sterile technique including full sterile barrier drape, hand hygiene, sterile gown, sterile gloves, mask, hair covering, sterile ultrasound probe cover (if used). ? ? ?Procedure Description ?Area of catheter insertion was cleaned with chlorhexidine and draped in sterile fashion. With real-time ultrasound guidance an arterial catheter was placed into the right radial artery.  Appropriate arterial tracings confirmed on monitor.   ? ? ?Complications/Tolerance ?None; patient tolerated the procedure well. ? ? ?EBL ?Minimal ? ? ?Specimen(s) ?None ? ?[name], MD ?Internal Medicine Resident PGY-3 ?[facility] ?Pager: [phone] ?10/01/2021 3:26 PM  ?  ?

## 2021-10-04 NOTE — Code Documentation (Signed)
Pulse check- PEA

## 2021-10-04 NOTE — Procedures (Signed)
Patient Name: Eddie Glass  ?MRN: [id number]  ?Epilepsy Attending: Lora Havens  ?Referring Physician/Provider: [name], MD ?Date: 10/01/2021 ?Duration: 23.35 mins ? ?Patient history: 56yo M s/p cardiac arrest. EEG to evaluate for seizure ? ?Level of alertness: comatose ? ?AEDs during EEG study: None ? ?Technical aspects: This EEG study was done with scalp electrodes positioned according to the 10-20 International system of electrode placement. Electrical activity was acquired at a sampling rate of '500Hz'$  and reviewed with a high frequency filter of '70Hz'$  and a low frequency filter of '1Hz'$ . EEG data were recorded continuously and digitally stored.  ? ?Description: EEG showed burst suppression with bursts of generalized polymorphic sharply contoured 3 to 6 Hz theta-delta slowing lasting 3-5 seconds admixed with generalized spikes alternating with generalized eeg suppression lasting 3-5 seconds.Hyperventilation and photic stimulation were not performed.    ? ?ABNORMALITY ?-Spike,generalized ?-Burst suppression, generalized ? ?IMPRESSION: ?This study showed evidence of epileptogenicity with generalized onset as well profound diffuse encephalopathy. 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 No definite seizures were seen. ? ?Dr Tacy Learn was notified.  ? ? ?Katrianna Friesenhahn Barbra Sarks  ? ?

## 2021-10-04 NOTE — H&P (Signed)
? ?NAME:  Eddie Glass, MRN:  [id number], DOB:  February 03, 1966, LOS: 0 ?ADMISSION DATE:  10/01/2021, CONSULTATION DATE:  09/18/2021 ?REFERRING MD:  Maryan Rued, CHIEF COMPLAINT:  cardiac arrest  ? ?History of Present Illness:  ?Eddie Glass is a 56 year old male who presented to [facility] ED via EMS after experiencing a cardiac arrest at home earlier this morning. His mother and sister contributed to history however they do not chat with him on a regular basis so have limited information.  ?According to them, he was at home this morning, when he began complaining of shortness to his roommate. EMS was called however he collapsed 1-2 minutes prior to their arrival. Chest compressions were not initiated. He was PEA on arrival of the fire department. Chest compressions were started. He was found to be in asystole on arrival of EMS. ACLS was initiated. He had intermittent periods of ROSC but continued to go back into cardiac arrest. Received 9 total rounds of epi. Total duration reportedly over 45 minutes.  ? ? ?Pertinent  Medical History  ?Asthma ?Hypertension, Hyperlipidemia ?Type 2 DM ? ?Significant Hospital Events: ?Including procedures, antibiotic start and stop dates in addition to other pertinent events   ?3/22 ICU admission for cardiac arrest ? ?Interim History / Subjective:  ?N/a ? ?Objective   ?Blood pressure 112/70, pulse 77, temperature (!) 96.5 ?F (35.8 ?C), temperature source Bladder, resp. rate (!) 26, height '6\' 2"'$  (1.88 m), weight 122 kg, SpO2 96 %. ?   ?   ? ?Intake/Output Summary (Last 24 hours) at 09/20/2021 0913 ?Last data filed at 09/23/2021 5170 ?Gross per 24 hour  ?Intake 50 ml  ?Output --  ?Net 50 ml  ? ?Filed Weights  ? 09/22/2021 0756  ?Weight: 122 kg  ? ? ?Examination: ?General: critically ill appearing ?HEENT: ETT, superficial abrasions on forehead ?Cardiac: RRR, no LE edema ?Pulm: on full vent support. Minimal breath sounds appreciated bilaterally. ?GI: distended abdomen ?Skin: warm and dry ?Neuro: off  sedation/paralytics. Unresponsive. Pinpoint pupils, no corneal reflex, fixed gaze, no doll's eyes. No gag reflex. Does not respond to painful stimuli.  ? ?Resolved Hospital Problem list   ? ? ?Assessment & Plan:  ? ?OOH Respiratory>cardiac arrest (PEA and asystole) ?-total down time suspected to be >45 minutes ?-head CT with loss of grey/white matter differentiation concerning for anoxic injury ?Acute mixed respiratory failure requiring mechanical ventilation ?-no consolidations or PE on CXR/CTA ?Asthma Exacerbation ?-COVID, Influenza A/B negative ?Mixed respiratory and metabolic acidosis ?-initial pH 6.7; lactate >9; pCO2 86 ?Non-displaced rib fx ?Acute kidney injury ?Elevated INR/PTT ?Type 2 diabetes with hyperglycemia ?Hx of hypertension, hyperlipemia ? ?Discussion: Suspect cardiac arrest was driven by respiratory arrest in the context of his history. Unfortunately, he had a prolonged down time exceeding 45 minutes, which, along with CT and exam findings (while off sedation and paralytics), is concerning. Too soon for neuro-prognostication at this point however; will need to correct metabolic derangements. Would consider his prognosis guarded.  ? ?Plan ?Admit to ICU ?Continue full vent support ?Continue solumedrol ?Continuous nebs ?Continue epi, levo and vasopressin with MAP goal >48mHg ?Maintain neuro-protective measures ?Start bicarb gtt ?Check echo ?Appreciate cardiology input ?On heparin gtt ?Trend ABGs ?SSI q4 ? ?Best Practice (right click and "Reselect all SmartList Selections" daily)  ? ?Diet/type: NPO ?DVT prophylaxis: systemic heparin ?GI prophylaxis: PPI ?Lines: Central line ?Foley:  Yes, and it is still needed ?Code Status:  full code ?Last date of multidisciplinary goals of care discussion [pending] ? ?  Labs   ?CBC: ?Recent Labs  ?Lab 10/11/2021 ?0724 10/09/2021 ?5400  ?WBC 9.5  --   ?NEUTROABS 3.1  --   ?HGB 13.4 13.9  14.3  ?HCT 44.1 41.0  42.0  ?MCV 87.7  --   ?PLT 271  --   ? ? ?Basic Metabolic  Panel: ?Recent Labs  ?Lab 09/24/2021 ?0724 09/13/2021 ?8676  ?NA 142 139  140  ?K 5.1 5.0  5.0  ?CL 105 111  ?CO2 11*  --   ?GLUCOSE 301* 270*  ?BUN 16 19  ?CREATININE 2.05* 1.60*  ?CALCIUM 9.5  --   ? ?GFR: ?Estimated Creatinine Clearance: 72.4 mL/min (A) (by C-G formula based on SCr of 1.6 mg/dL (H)). ?Recent Labs  ?Lab 09/16/2021 ?0724 09/16/2021 ?0802  ?WBC 9.5  --   ?LATICACIDVEN  --  >9.0*  ? ? ?Liver Function Tests: ?Recent Labs  ?Lab 10/02/2021 ?0724  ?AST 48*  ?ALT 20  ?ALKPHOS 71  ?BILITOT 0.4  ?PROT 6.6  ?ALBUMIN 3.4*  ? ?No results for input(s): LIPASE, AMYLASE in the last 168 hours. ?No results for input(s): AMMONIA in the last 168 hours. ? ?ABG ?   ?Component Value Date/Time  ? HCO3 11.0 (L) 09/27/2021 0734  ? TCO2 16 (L) 09/22/2021 0734  ? TCO2 14 (L) 10/01/2021 0734  ? ACIDBASEDEF 26.0 (H) 09/21/2021 0734  ? O2SAT 83 09/26/2021 0734  ?  ? ?Coagulation Profile: ?Recent Labs  ?Lab 10/08/2021 ?0724  ?INR 1.8*  ? ? ?Cardiac Enzymes: ?No results for input(s): CKTOTAL, CKMB, CKMBINDEX, TROPONINI in the last 168 hours. ? ?HbA1C: ?Hemoglobin A1C  ?Date/Time Value Ref Range Status  ?09/21/2021 09:39 AM 7.0 (A) 4.0 - 5.6 % Final  ? ?HbA1c, POC (controlled diabetic range)  ?Date/Time Value Ref Range Status  ?02/20/2021 10:42 AM 6.5 0.0 - 7.0 % Final  ? ?Hgb A1c MFr Bld  ?Date/Time Value Ref Range Status  ?09/05/2020 10:47 AM 6.6 (H) <5.7 % of total Hgb Final  ?  Comment:  ?  For someone without known diabetes, a hemoglobin A1c ?value of 6.5% or greater indicates that they may have  ?diabetes and this should be confirmed with a follow-up  ?test. ?. ?For someone with known diabetes, a value <7% indicates  ?that their diabetes is well controlled and a value  ?greater than or equal to 7% indicates suboptimal  ?control. A1c targets should be individualized based on  ?duration of diabetes, age, comorbid conditions, and  ?other considerations. ?. ?Currently, no consensus exists regarding use of ?hemoglobin A1c for diagnosis of  diabetes for children. ?. ?  ?08/31/2019 03:36 PM 6.6 (H) <5.7 % of total Hgb Final  ?  Comment:  ?  For someone without known diabetes, a hemoglobin A1c ?value of 6.5% or greater indicates that they may have  ?diabetes and this should be confirmed with a follow-up  ?test. ?. ?For someone with known diabetes, a value <7% indicates  ?that their diabetes is well controlled and a value  ?greater than or equal to 7% indicates suboptimal  ?control. A1c targets should be individualized based on  ?duration of diabetes, age, comorbid conditions, and  ?other considerations. ?. ?Currently, no consensus exists regarding use of ?hemoglobin A1c for diagnosis of diabetes for children. ?. ?  ? ? ?CBG: ?Recent Labs  ?Lab 10/01/2021 ?0730  ?GLUCAP 294*  ? ? ?Review of Systems:   ?Unable to be obtained due to critical illness ? ?Past Medical History:  ?He,  has a past medical history  of Allergy, Anemia, Asthma, COVID-19 (08/31/2019), and Hyperlipidemia (07/01/2018).  ? ?Surgical History:  ?History reviewed. No pertinent surgical history.  ? ?Social History:  ? reports that he has never smoked. He has never used smokeless tobacco. He reports current alcohol use of about 5.0 standard drinks per week. He reports that he does not use drugs.  ? ?Family History:  ?His family history includes Cancer in his father; Diabetes in his mother. There is no history of Colon cancer, Esophageal cancer, Rectal cancer, or Stomach cancer.  ? ?Allergies ?Allergies  ?Allergen Reactions  ? Other Anaphylaxis  ?  Cats  ?  ? ?Home Medications  ?Prior to Admission medications   ?Medication Sig Start Date End Date Taking? Authorizing Provider  ?albuterol (PROVENTIL) (2.5 MG/3ML) 0.083% nebulizer solution Take 3 mLs (2.5 mg total) by nebulization every 6 (six) hours as needed for wheezing or shortness of breath. 06/13/21   Luetta Nutting, DO  ?albuterol (VENTOLIN HFA) 108 (90 Base) MCG/ACT inhaler Inhale 2 puffs into the lungs every 6 (six) hours as needed for  wheezing or shortness of breath. 09/20/21   Luetta Nutting, DO  ?Cholecalciferol (VITAMIN D) 50 MCG (2000 UT) tablet Take 1 tablet (2,000 Units total) by mouth daily. 09/12/20   Eulogio Bear, NP  ?ferrous sulfat

## 2021-10-04 NOTE — ED Triage Notes (Signed)
Pt BIB [facility] s/p cardiac arrest this AM. EMS reports pt stated that he was SOB this AM, used his inhaler and then went unresponsive. Per EMS pt was in Asystole on their arrival. ROSC was achieved and lost six times en route to ED. Pt rec'd a total of 9 Epi. Pt w/ R IO in place by EMS. On arrival to ED, pt brady'd down and pulses lost once again. 2 rounds of CPR and 2 Epi and ROSC achieved again in ED. Epi drip initiated for bradycardia and hypotension. Code STEMI activated based on EKG after ED ROSC ?

## 2021-10-04 NOTE — Progress Notes (Signed)
ANTICOAGULATION CONSULT NOTE - Initial Consult ? ?Pharmacy Consult for Heparin ?Indication: chest pain/ACS ? ?Allergies  ?Allergen Reactions  ? Other Anaphylaxis  ?  Cats  ? ? ?Patient Measurements: ?Height: '6\' 2"'$  (188 cm) ?Weight: 122 kg (268 lb 15.4 oz) ?IBW/kg (Calculated) : 82.2 ?Heparin Dosing Weight: 108.5 kg  ? ?Vital Signs: ?Temp: 96.8 ?F (36 ?C) (03/22 1100) ?Temp Source: Oral (03/22 4765) ?BP: 107/53 (03/22 1100) ?Pulse Rate: 94 (03/22 1100) ? ?Labs: ?Recent Labs  ?  10/03/2021 ?0724 10/01/2021 ?0734 10/09/2021 ?1036  ?HGB 13.4 13.9  14.3  --   ?HCT 44.1 41.0  42.0  --   ?PLT 271  --   --   ?APTT 59*  --   --   ?LABPROT 21.2*  --   --   ?INR 1.8*  --   --   ?CREATININE 2.05* 1.60*  --   ?TROPONINIHS 60*  --  4,500*  ? ? ?Estimated Creatinine Clearance: 72.4 mL/min (A) (by C-G formula based on SCr of 1.6 mg/dL (H)). ? ? ?Medical History: ?Past Medical History:  ?Diagnosis Date  ? Allergy   ? seasonal  ? Anemia   ? Asthma   ? COVID-19 08/31/2019  ? Hyperlipidemia 07/01/2018  ? ? ? ?Assessment: ?56 yo male admitted on 09/30/2021 with cardiac arrest. Pharmacy consulted for heparin for ACS. Heparin 4000 unit bolus given in ED. CT negative for PE. Hgb 13.4. Plt 271. Troponin 4500.  ? ? ?Goal of Therapy:  ?Heparin level 0.3-0.7 units/ml ?Monitor platelets by anticoagulation protocol: Yes ?  ?Plan:  ?Start heparin 1400 units/hr ?Check 6 hr heparin level ?Monitor heparin level, CBC and s/s of bleeding  ?Follow up cardiology recommendations ? ?Cristela Felt, PharmD, BCPS ?Clinical Pharmacist ?10/03/2021 1:23 PM ? ? ? ?

## 2021-10-04 NOTE — Code Documentation (Signed)
Pulse check-pulse present, rate 103. Ectopy ?

## 2021-10-04 NOTE — Progress Notes (Signed)
vLTM started  all impedances below 10kohms   [facility] to monitor   Patient event button tested 

## 2021-10-04 NOTE — Consult Note (Signed)
Cardiology Consultation:   Patient ID: Eddie Glass MRN: [id number]; DOB: [birth year]  Admit date: 10/04/2021 Date of Consult: 10/04/2021  PCP:  Everrett Coombe, DO   [facility] Providers Cardiologist:  None        Patient Profile:   Eddie Glass is a 56 y.o. male with a hx of hypertension and diabetes who is being seen 10/04/2021 for the evaluation of cardiac arrest at the request of Dr. Wilkie Aye.  History of Present Illness:   Eddie Glass reportedly had acute shortness of breath this morning and then collapsed shortly thereafter.  He was at home when this occurred and fire department was there within 1 to 2 minutes.  On their arrival he was reportedly pulseless with asystole.  He received CPR until EMS arrived when he had a PEA rhythm.  He continued to undergo resuscitative efforts reportedly over 45 to 50 minutes when he received 9 mg of epinephrine along with CPR and airway establishment with a King airway.  The patient was intubated here, again lost pulse a few times, but now has achieved ROSC.  An EKG demonstrated diffuse ST depression and a code STEMI was paged.  At the time of my arrival, I discussed his case extensively with Dr. Wilkie Aye who is at the bedside managing the patient.  No other history is currently obtainable.  The patient is intubated and unresponsive.   Past Medical History:  Diagnosis Date   Allergy    seasonal   Anemia    Asthma    COVID-19 08/31/2019   Hyperlipidemia 07/01/2018    History reviewed. No pertinent surgical history.   Home Medications:  Prior to Admission medications   Medication Sig Start Date End Date Taking? Authorizing Provider  albuterol (PROVENTIL) (2.5 MG/3ML) 0.083% nebulizer solution Take 3 mLs (2.5 mg total) by nebulization every 6 (six) hours as needed for wheezing or shortness of breath. 06/13/21   Everrett Coombe, DO  albuterol (VENTOLIN HFA) 108 (90 Base) MCG/ACT inhaler Inhale 2 puffs into the lungs every 6 (six) hours as needed  for wheezing or shortness of breath. 09/20/21   Everrett Coombe, DO  Cholecalciferol (VITAMIN D) 50 MCG (2000 UT) tablet Take 1 tablet (2,000 Units total) by mouth daily. 09/12/20   Valentino Nose, NP  ferrous sulfate 325 (65 FE) MG EC tablet Take 1 tablet (325 mg total) by mouth daily with breakfast. 09/12/20   Valentino Nose, NP  Fluticasone-Salmeterol (ADVAIR DISKUS) 500-50 MCG/DOSE AEPB Inhale 1 puff into the lungs 2 (two) times daily. Rinse mouth out after using. 09/12/20   Valentino Nose, NP  lisinopril-hydrochlorothiazide (ZESTORETIC) 20-12.5 MG tablet Take 1 tablet by mouth daily. 09/20/21   Everrett Coombe, DO  tadalafil (CIALIS) 20 MG tablet Take 0.5-1 tablets (10-20 mg total) by mouth every other day as needed for erectile dysfunction. 12/29/20   Everrett Coombe, DO    Inpatient Medications: Scheduled Meds:  albuterol  10 mg Nebulization Once   albuterol  20 mg Nebulization Once   ipratropium-albuterol  3 mL Nebulization Once   Continuous Infusions:  epinephrine 20 mcg/min (10/04/21 0729)   fentaNYL infusion INTRAVENOUS 100 mcg/hr (10/04/21 0748)   PRN Meds: EPINEPHrine, EPINEPHrine, fentaNYL, fentaNYL, midazolam, midazolam, rocuronium  Allergies:    Allergies  Allergen Reactions   Other Anaphylaxis    Cats    Social History:   Social History   Socioeconomic History   Marital status: Single    Spouse name: Not on file   Number of  children: 1   Years of education: Not on file   Highest education level: Not on file  Occupational History   Occupation: [employer] Dist[employer]  Tobacco Use   Smoking status: Never   Smokeless tobacco: Never  Vaping Use   Vaping Use: Never used  Substance and Sexual Activity   Alcohol use: Yes    Alcohol/week: 5.0 standard drinks    Types: 5 Standard drinks or equivalent per week    Comment: 0-3 times a week, social, 2-3 drinks of everything, burbon club   Drug use: Never   Sexual activity: Not Currently    Partners: Female  Other  Topics Concern   Not on file  Social History Narrative   Not on file   Social Determinants of Health   Financial Resource Strain: Not on file  Food Insecurity: Not on file  Transportation Needs: Not on file  Physical Activity: Not on file  Stress: Not on file  Social Connections: Not on file  Intimate Partner Violence: Not on file    Family History:   Family History  Problem Relation Age of Onset   Diabetes Mother    Cancer Father        lung cancer   Colon cancer Neg Hx    Esophageal cancer Neg Hx    Rectal cancer Neg Hx    Stomach cancer Neg Hx      ROS:  Please see the history of present illness.  All other ROS reviewed and negative.     Physical Exam/Data:   Vitals:   10/04/21 0742 10/04/21 0745 10/04/21 0756 10/04/21 0758  BP: 95/62 107/64    Pulse: 67 70    Resp: 12 13    Temp:    (!) 96.5 F (35.8 C)  TempSrc:    Bladder  SpO2: 97% 98%    Weight:   122 kg   Height:   6\' 2"  (1.88 m)     Intake/Output Summary (Last 24 hours) at 10/04/2021 0800 Last data filed at 10/04/2021 0743 Gross per 24 hour  Intake 50 ml  Output --  Net 50 ml   Last 3 Weights 10/04/2021 09/21/2021 03/23/2021  Weight (lbs) 268 lb 15.4 oz 270 lb 256 lb  Weight (kg) 122 kg 122.471 kg 116.121 kg     Body mass index is 34.53 kg/m.  General: Obese male, intubated, unresponsive HEENT: normal Neck: Collar in place Vascular: Distal pulses 2+ bilaterally Cardiac:  normal S1, S2; RRR; no murmur  Lungs: Diffuse rhonchi Abd: soft, no hepatomegaly  Ext: no edema Musculoskeletal:  No deformities Skin: warm and dry  Neuro: Unresponsive, unable to further assess Psych: Unable to assess  EKG:  The EKG was personally reviewed and demonstrates:  Sinus rhythm with diffuse upsloping ST depression  Telemetry:  Telemetry was personally reviewed and demonstrates: Sinus rhythm  Relevant CV Studies: None  Laboratory Data:  High Sensitivity Troponin:  No results for input(s): TROPONINIHS in the  last 720 hours.   Chemistry Recent Labs  Lab 10/04/21 0734  NA 139  140  K 5.0  5.0  CL 111  GLUCOSE 270*  BUN 19  CREATININE 1.60*    No results for input(s): PROT, ALBUMIN, AST, ALT, ALKPHOS, BILITOT in the last 168 hours. Lipids No results for input(s): CHOL, TRIG, HDL, LABVLDL, LDLCALC, CHOLHDL in the last 168 hours.  Hematology Recent Labs  Lab 10/04/21 0724 10/04/21 0734  WBC 9.5  --   RBC 5.03  --   HGB  13.4 13.9  14.3  HCT 44.1 41.0  42.0  MCV 87.7  --   MCH 26.6  --   MCHC 30.4  --   RDW 15.0  --   PLT 271  --    Thyroid No results for input(s): TSH, FREET4 in the last 168 hours.  BNPNo results for input(s): BNP, PROBNP in the last 168 hours.  DDimer No results for input(s): DDIMER in the last 168 hours.   Radiology/Studies:  DG Chest Portable 1 View  Result Date: 2021-10-22 CLINICAL DATA:  Tube placement EXAM: PORTABLE CHEST 1 VIEW COMPARISON:  10/13/2020 FINDINGS: Endotracheal tube with tip just below the clavicular heads. The enteric tube tip and side-port reaches the stomach. Artifact from EKG leads and defibrillator pads. There is no edema, consolidation, effusion, or pneumothorax. Normal heart size and mediastinal contours. IMPRESSION: 1. Unremarkable hardware. 2. Clear lungs. Electronically Signed   By: Tiburcio Pea M.D.   On: October 22, 2021 07:49     Assessment and Plan:   Out of hospital PEA/asystolic arrest Severe mixed respiratory and metabolic acidosis Acute kidney injury Anoxic brain injury  The patient does not have a diagnostic EKG for STEMI.  His EKG is done in the setting of severe acidosis.  Would reassess an EKG once his acid-base is corrected.  His arrest scenario is unfavorable and without any ventricular arrhythmia, unfortunately I think his prognosis is quite poor.  I do not think he is a candidate for emergent cardiac catheterization at this point.  Differential diagnosis includes severe asthma, acute pulmonary embolism, acute  coronary syndrome, amongst other etiologies.  Further management per emergency room and CCM teams.  We will follow with you.  Patient appropriately on IV heparin at present.  Would continue until diagnosis is clarified.  Unfortunately after a prolonged arrest and resuscitation, I suspect he has had significant anoxic brain injury.  The patient is critically ill with multiple organ systems failure and requires high complexity decision making for assessment and support, frequent evaluation and titration of therapies, application of advanced monitoring technologies and extensive interpretation of multiple databases.   Critical Care Time devoted to patient care services described in this note is 40 minutes.    Risk Assessment/Risk Scores:                For questions or updates, please contact [facility] Please consult [email or web address] for contact info under    Signed, Tonny Bollman, MD  10/22/2021 8:00 AM

## 2021-10-04 NOTE — Code Documentation (Signed)
Pt brady'd down to asystole and CPR intiated ?

## 2021-10-04 NOTE — Progress Notes (Signed)
Continuous aerosol therapy treatment completed. ?

## 2021-10-04 NOTE — Progress Notes (Signed)
eLink Physician-Brief Progress Note ?Patient Name: Eddie Glass ?DOB: 02/04/1966 ?MRN: [id number] ? ? ?Date of Service ? 09/29/2021  ?HPI/Events of Note ? Multiple issues: 1. Hypotension - BP = 72/49 with MAP = 57 and 2. ABG on 50%/PRVC 30/TV 500/P 20 = 7.027/73.3/104/21. Already on a NaHCO3 IV infusion at 150 mL/hour, Epinephrine, Norepinephrine and Vasopressin IV infusions.   ?eICU Interventions ? Plan: ?Increase ceiling on Norepinephrine IV infusion to 70 mcg/min. ?NaHCO3 200 meq IV now. ?Repeat ABG at 12 midnight.   ? ? ? ?Intervention Category ?Major Interventions: Respiratory failure - evaluation and management;Acid-Base disturbance - evaluation and management ? ?Eddie Glass ?09/23/2021, 8:19 PM ?

## 2021-10-04 NOTE — Progress Notes (Signed)
Chaplain responded to Code Stemi.  EMT indicated someone was present at home and that family will be here within the hour.  Checked in with security and no family present.  Code Stemi was canceled.  Chaplain available for family support when they arrive. ?[place], [place]. ? ? ? 09/19/2021 0752  ?Clinical Encounter Type  ?Visited With Health care provider;Patient not available  ?Visit Type ED  ?Referral From Nurse  ?Consult/Referral To Chaplain  ? ? ?

## 2021-10-04 NOTE — Care Plan (Signed)
?  Interdisciplinary Goals of Care Family Meeting ? ? ?Date carried out:: 09/19/2021 ? ?Location of the meeting: Phone conference ? ?Member's involved: Physician, Bedside Registered Nurse, and Family Member or next of kin ? ?Durable Power of Attorney or acting medical decision maker: Doris Greis   ? ?Discussion: We discussed goals of care for Eddie Glass .   ? ?GOALS OF CARE DISCUSSION ?  ?The Clinical status was relayed to patient's mother over the phone in detail. ?  ?Updated and notified of patients medical condition. ?  ?  ?Patient remains unresponsive and will not open eyes to command.   ?Patient with no cough, gag, corneal, pupillary or motor response to painful stimuli, consistent with severe anoxic brain injury ?Explained to family course of therapy and the modalities  ?Signs of brain damage ?Evidence of kidney failure ?  ?Patient with Progressive multiorgan failure with a very high probablity of a very minimal chance of meaningful recovery despite all aggressive and optimal medical therapy. ?  ?Family are satisfied with Plan of action and management. All questions answered ? ? ?Code status: Full DNR ? ?Disposition: Continue current acute care ? ? ?Time spent for the meeting: 30 min ? ?Jacky Kindle MD ?[facility] ?See Amion for pager ?If no response to pager, please call [phone] until 7pm ?After 7pm, Please call E-link [phone] ? ?

## 2021-10-04 NOTE — Progress Notes (Signed)
STAT EEG complete - results pending. ? ?

## 2021-10-04 NOTE — Progress Notes (Signed)
Responded to ED referral from on call night Chaplain to continue support to patient and family.  Patient is currently intubated and unresponsive. Escorted mother and sister to bedside. Provided emotional and spiritual support and presence. ? ?Cristopher Peru, [facility], Pager [phone]   ?

## 2021-10-04 NOTE — ED Provider Notes (Signed)
[facility] EMERGENCY DEPARTMENT Provider Note   CSN: [id number] Arrival date & time: 10/04/21  0719     History  Chief Complaint  Patient presents with   CPR In Progress    Eddie Glass is a 56 y.o. male.  Patient is a 56 year old male with a history of asthma, hyperlipidemia, hypertension who is presenting today as a cardiac arrest at home.  All information was gained by paramedics who reported patient initially woke up this morning and was complaining of shortness of breath.  They had already called the ambulance and prior to the fire department arriving patient collapsed and upon their arrival was in cardiac arrest.  Patient did not receive any bystander CPR but fire was there within 1 to 2 minutes of his collapse and started CPR immediately.  Patient was in a PEA rhythm initially but when EMS arrived he was in asystole.  Patient received ongoing CPR and epi.  There was return of pulses.  He had intermittent loss of pulses in transit and had intermittent epi and CPR over 45 minutes.  Patient upon arrival had a King airway in place.  He had an IO in his left tib-fib.  He had received 200 mL of fluid had a blood sugar of 200 and were reporting normal O2 sats.  No further history is known at this time  The history is provided by the EMS personnel and medical records. The history is limited by the condition of the patient.      Home Medications Prior to Admission medications   Medication Sig Start Date End Date Taking? Authorizing Provider  albuterol (PROVENTIL) (2.5 MG/3ML) 0.083% nebulizer solution Take 3 mLs (2.5 mg total) by nebulization every 6 (six) hours as needed for wheezing or shortness of breath. 06/13/21   Everrett Coombe, DO  albuterol (VENTOLIN HFA) 108 (90 Base) MCG/ACT inhaler Inhale 2 puffs into the lungs every 6 (six) hours as needed for wheezing or shortness of breath. 09/20/21   Everrett Coombe, DO  Cholecalciferol (VITAMIN D) 50 MCG (2000 UT) tablet  Take 1 tablet (2,000 Units total) by mouth daily. 09/12/20   Valentino Nose, NP  ferrous sulfate 325 (65 FE) MG EC tablet Take 1 tablet (325 mg total) by mouth daily with breakfast. 09/12/20   Valentino Nose, NP  Fluticasone-Salmeterol (ADVAIR DISKUS) 500-50 MCG/DOSE AEPB Inhale 1 puff into the lungs 2 (two) times daily. Rinse mouth out after using. 09/12/20   Valentino Nose, NP  lisinopril-hydrochlorothiazide (ZESTORETIC) 20-12.5 MG tablet Take 1 tablet by mouth daily. 09/20/21   Everrett Coombe, DO  tadalafil (CIALIS) 20 MG tablet Take 0.5-1 tablets (10-20 mg total) by mouth every other day as needed for erectile dysfunction. 12/29/20   Everrett Coombe, DO      Allergies    Other    Review of Systems   Review of Systems  Physical Exam Updated Vital Signs BP 107/64   Pulse 70   Temp (!) 96.5 F (35.8 C) (Bladder)   Resp 13   Ht 6\' 2"  (1.88 m)   Wt 122 kg   SpO2 98%   BMI 34.53 kg/m  Physical Exam Vitals and nursing note reviewed.  Constitutional:      General: He is in acute distress.     Appearance: He is toxic-appearing.  HENT:     Head: Normocephalic.     Mouth/Throat:     Mouth: Mucous membranes are moist.  Eyes:     Comments: Pupils  are 3 mm bilaterally and nonreactive  Cardiovascular:     Rate and Rhythm: Bradycardia present.  Pulmonary:     Effort: Respiratory distress present.     Comments: Patient has agonal spontaneous breathing but no breath sounds bilaterally Abdominal:     General: There is distension.     Comments: Firm  Musculoskeletal:     Cervical back: Normal range of motion.     Right lower leg: No edema.     Left lower leg: No edema.     Comments: [place] present in the left tib-fib  Skin:    General: Skin is warm.  Neurological:     Comments: He is triggering breaths but no other purposeful movement  Psychiatric:     Comments: Unresponsive    ED Results / Procedures / Treatments   Labs (all labs ordered are listed, but only abnormal  results are displayed) Labs Reviewed  CBC WITH DIFFERENTIAL/PLATELET - Abnormal; Notable for the following components:      Result Value   nRBC 0.5 (*)    Lymphs Abs 5.1 (*)    Abs Immature Granulocytes 0.65 (*)    All other components within normal limits  PROTIME-INR - Abnormal; Notable for the following components:   Prothrombin Time 21.2 (*)    INR 1.8 (*)    All other components within normal limits  I-STAT CHEM 8, ED - Abnormal; Notable for the following components:   Creatinine, Ser 1.60 (*)    Glucose, Bld 270 (*)    TCO2 16 (*)    All other components within normal limits  I-STAT VENOUS BLOOD GAS, ED - Abnormal; Notable for the following components:   pH, Ven 6.715 (*)    pCO2, Ven 86.0 (*)    pO2, Ven 96 (*)    Bicarbonate 11.0 (*)    TCO2 14 (*)    Acid-base deficit 26.0 (*)    All other components within normal limits  CBG MONITORING, ED - Abnormal; Notable for the following components:   Glucose-Capillary 294 (*)    All other components within normal limits  RESP PANEL BY RT-PCR (FLU A&B, COVID) ARPGX2  COMPREHENSIVE METABOLIC PANEL  ETHANOL  LACTIC ACID, PLASMA  LACTIC ACID, PLASMA  BRAIN NATRIURETIC PEPTIDE  APTT  TROPONIN I (HIGH SENSITIVITY)    EKG EKG Interpretation  Date/Time:  Wednesday Nov 01, 2021 07:40:30 EDT Ventricular Rate:  67 PR Interval:  135 QRS Duration: 123 QT Interval:  413 QTC Calculation: 436 R Axis:   147 Text Interpretation: Sinus rhythm Nonspecific intraventricular conduction delay Repol abnrm, severe global ischemia (LM/MVD) Confirmed by Gwyneth Sprout ([id number]) on 11-01-21 7:55:31 AM  Radiology DG Chest Portable 1 View  Result Date: 2021-11-01 CLINICAL DATA:  Tube placement EXAM: PORTABLE CHEST 1 VIEW COMPARISON:  10/13/2020 FINDINGS: Endotracheal tube with tip just below the clavicular heads. The enteric tube tip and side-port reaches the stomach. Artifact from EKG leads and defibrillator pads. There is no edema,  consolidation, effusion, or pneumothorax. Normal heart size and mediastinal contours. IMPRESSION: 1. Unremarkable hardware. 2. Clear lungs. Electronically Signed   By: Tiburcio Pea M.D.   On: 11/01/2021 07:49    Procedures Date/Time: November 01, 2021 8:09 AM Performed by: Gwyneth Sprout, MD Preoxygenation: Pre-oxygenation with 100% oxygen Ventilation: Oral airway inserted - appropriate to patient size Laryngoscope Size: 4 and Glidescope Tube size: 7.5 mm Number of attempts: 1 Placement Confirmation: ETT inserted through vocal cords under direct vision and Positive ETCO2 Secured at: 26 cm Tube secured  with: ETT holder Dental Injury: Teeth and Oropharynx as per pre-operative assessment  Difficulty Due To: Difficulty was unanticipated    CPR  Date/Time: 10/04/2021 8:10 AM Performed by: Gwyneth Sprout, MD Authorized by: Gwyneth Sprout, MD  CPR Procedure Details:      Amount of time prior to administration of ACLS/BLS (minutes):  2   ACLS/BLS initiated by EMS: Yes     CPR/ACLS performed in the ED: Yes     Outcome: ROSC obtained    CPR performed via ACLS guidelines under my direct supervision.  See RN documentation for details including defibrillator use, medications, doses and timing.    Medications Ordered in ED Medications  EPINEPHrine (ADRENALIN) 1 MG/10ML injection (1 mg Intravenous Given 10/04/21 0719)  EPINEPHrine (ADRENALIN) 1 MG/10ML injection (1 mg Intravenous Given 10/04/21 0721)  fentaNYL in NS (39mcg/ml) infusion-PREMIX (100 mcg/hr Intravenous Rate/Dose Change 10/04/21 0748)  fentaNYL (SUBLIMAZE) bolus via infusion 50 mcg (has no administration in time range)  midazolam (VERSED) injection 2 mg (has no administration in time range)  midazolam (VERSED) injection 2 mg (has no administration in time range)  fentaNYL (SUBLIMAZE) injection (100 mcg Intravenous Given 10/04/21 0748)  EPINEPHrine (ADRENALIN) 5 mg in NS 250 mL (0.02 mg/mL) premix infusion (20  mcg/min Intravenous Rate/Dose Change 10/04/21 0729)  rocuronium (ZEMURON) injection (100 mg Intravenous Given 10/04/21 0753)  sodium bicarbonate injection 100 mEq (has no administration in time range)  methylPREDNISolone sodium succinate (SOLU-MEDROL) 125 mg/2 mL injection 125 mg (125 mg Intravenous Given 10/04/21 0732)  magnesium sulfate IVPB 2 g 50 mL (0 g Intravenous Stopped 10/04/21 0743)  heparin injection 4,000 Units (4,000 Units Intravenous Given 10/04/21 0735)  albuterol (PROVENTIL) (2.5 MG/3ML) 0.083% nebulizer solution 10 mg (10 mg Nebulization Given 10/04/21 1610)    ED Course/ Medical Decision Making/ A&P                           Medical Decision Making Amount and/or Complexity of Data Reviewed Independent Historian: EMS External Data Reviewed: notes. Labs: ordered. Decision-making details documented in ED Course. Radiology: ordered and independent interpretation performed. Decision-making details documented in ED Course. ECG/medicine tests: ordered and independent interpretation performed. Decision-making details documented in ED Course.  Risk Prescription drug management. Decision regarding hospitalization.   Patient is a 48 male presenting after cardiac arrest at home after complaining of shortness of breath.  Patient's external medical records from his PCP were evaluated patient received 9 epi, intermittent CPR and upon arrival to the emergency room he does have a pulse.  Patient is very hard to bag with stiff lungs and no breath sounds auscultated.  King airway was removed and a 7.5 tube was placed without difficulty.  Patient continued to trigger breaths but is pulling high plateau pressures in the 30s, auto peeping.  He was placed on prolonged expiratory phase with permissive hypoxia but sats remain in the high 90s.  OG tube was placed with improvement in abdominal distention.  Patient's EKG that I independently interpreted showed concern for global ischemia with significant ST  depression in multiple leads.  A code STEMI was called after EKG was evaluated.  Dr. Excell Seltzer with cardiology came and evaluated the patient however at this time feel the global ischemia is more related to his respiratory issues.  Patient is currently on an epinephrine drip as he did lose pulses once here requiring 2 minutes of CPR.  He was in his PEA rhythm at that time.  Because of difficulty ventilating patient was paralyzed as he is receiving Solu-Medrol, magnesium, albuterol and Atrovent.  I independently interpreted patient's labs and his initial blood gas showed a respiratory and metabolic acidosis with a CO2 of 86, bicarb of 11 and a pH of 6.7.  CBC without acute findings.  Patient started on heparin drip given the concern initially for a STEMI however cannot completely rule out a PE.  However this time patient is not stable enough to go to the scanner.  Lower suspicion for PE however and suspect most likely asthma exacerbation.  I independently visualized and interpreted patient's chest x-ray which shows good tube placement and no acute findings.  Patient meets admission criteria.  He was on the monitor continuously with a sinus rhythm.  He is critically ill with multiple core morbidities and has a high likelihood of morbidity and mortality.  ICU to see and admit the patient.  CRITICAL CARE Performed by: Eber Ferrufino Total critical care time: 50 minutes Critical care time was exclusive of separately billable procedures and treating other patients. Critical care was necessary to treat or prevent imminent or life-threatening deterioration. Critical care was time spent personally by me on the following activities: development of treatment plan with patient and/or surrogate as well as nursing, discussions with consultants, evaluation of patient's response to treatment, examination of patient, obtaining history from patient or surrogate, ordering and performing treatments and interventions, ordering and  review of laboratory studies, ordering and review of radiographic studies, pulse oximetry and re-evaluation of patient's condition.        Final Clinical Impression(s) / ED Diagnoses Final diagnoses:  Severe persistent asthma with exacerbation  Acute respiratory failure with hypoxia and hypercapnia (HCC)  Cardiac arrest (HCC)  Acidosis    Rx / DC Orders ED Discharge Orders     None         Gwyneth Sprout, MD 10/04/21 1548

## 2021-10-04 NOTE — Progress Notes (Addendum)
eLink Physician-Brief Progress Note ?Patient Name: Eddie Glass ?DOB: 1966/03/21 ?MRN: [id number] ? ? ?Date of Service ? 10/01/2021  ?HPI/Events of Note ? Multiple issues: 1. Hypotension - BP = 69/46. 2. ABG on 50%/PRVC 30/TV 500/P 20 = 7.065/88.2144/18.7. 3. Ionized Ca++ = 0.90.  ?eICU Interventions ? Plan:  ?NaHCO3 200 meq IV now.  ?Replace Ca++.  ? ? ? ?Intervention Category ?Major Interventions: Electrolyte abnormality - evaluation and management;Acid-Base disturbance - evaluation and management;Respiratory failure - evaluation and management ? ?Adalyna Godbee Cornelia Copa ?09/19/2021, 11:27 PM ?

## 2021-10-04 NOTE — Code Documentation (Signed)
7.5 ETT placed by MD Plunkett w/ good color change, w/ mist in the tube. Pt w/ decreased breath sounds.  ?

## 2021-10-04 NOTE — Code Documentation (Signed)
MD Plunkett attempting intubation at bedside to exchange [facility] placed ?

## 2021-10-05 ENCOUNTER — Inpatient Hospital Stay (HOSPITAL_COMMUNITY): Payer: Commercial Managed Care - PPO

## 2021-10-05 DIAGNOSIS — R092 Respiratory arrest: Secondary | ICD-10-CM

## 2021-10-05 DIAGNOSIS — Z8616 Personal history of COVID-19: Secondary | ICD-10-CM | POA: Diagnosis not present

## 2021-10-05 DIAGNOSIS — J4552 Severe persistent asthma with status asthmaticus: Secondary | ICD-10-CM | POA: Diagnosis not present

## 2021-10-05 DIAGNOSIS — Z66 Do not resuscitate: Secondary | ICD-10-CM | POA: Diagnosis not present

## 2021-10-05 DIAGNOSIS — Z20822 Contact with and (suspected) exposure to covid-19: Secondary | ICD-10-CM | POA: Diagnosis not present

## 2021-10-05 LAB — COMPREHENSIVE METABOLIC PANEL
ALT: 94 U/L — ABNORMAL HIGH (ref 0–44)
AST: 326 U/L — ABNORMAL HIGH (ref 15–41)
Albumin: 2.6 g/dL — ABNORMAL LOW (ref 3.5–5.0)
Alkaline Phosphatase: 66 U/L (ref 38–126)
Anion gap: 32 — ABNORMAL HIGH (ref 5–15)
BUN: 36 mg/dL — ABNORMAL HIGH (ref 6–20)
CO2: 19 mmol/L — ABNORMAL LOW (ref 22–32)
Calcium: 6.8 mg/dL — ABNORMAL LOW (ref 8.9–10.3)
Chloride: 98 mmol/L (ref 98–111)
Creatinine, Ser: 6.02 mg/dL — ABNORMAL HIGH (ref 0.61–1.24)
GFR, Estimated: 10 mL/min — ABNORMAL LOW (ref >60)
Glucose, Bld: 115 mg/dL — ABNORMAL HIGH (ref 70–99)
Potassium: 4.6 mmol/L (ref 3.5–5.1)
Sodium: 149 mmol/L — ABNORMAL HIGH (ref 135–145)
Total Bilirubin: 0.7 mg/dL (ref 0.3–1.2)
Total Protein: 5.2 g/dL — ABNORMAL LOW (ref 6.5–8.1)

## 2021-10-05 LAB — POCT I-STAT 7, (LYTES, BLD GAS, ICA,H+H)
Acid-base deficit: 4 mmol/L — ABNORMAL HIGH (ref 0.0–2.0)
Bicarbonate: 25.2 mmol/L (ref 20.0–28.0)
Calcium, Ion: 0.76 mmol/L — CL (ref 1.15–1.40)
HCT: 37 % — ABNORMAL LOW (ref 39.0–52.0)
Hemoglobin: 12.6 g/dL — ABNORMAL LOW (ref 13.0–17.0)
O2 Saturation: 99 %
Patient temperature: 100.4
Potassium: 4.2 mmol/L (ref 3.5–5.1)
Sodium: 148 mmol/L — ABNORMAL HIGH (ref 135–145)
TCO2: 27 mmol/L (ref 22–32)
pCO2 arterial: 65.8 mmHg (ref 32–48)
pH, Arterial: 7.197 — CL (ref 7.35–7.45)
pO2, Arterial: 194 mmHg — ABNORMAL HIGH (ref 83–108)

## 2021-10-05 LAB — GLUCOSE, CAPILLARY
Glucose-Capillary: 116 mg/dL — ABNORMAL HIGH (ref 70–99)
Glucose-Capillary: 116 mg/dL — ABNORMAL HIGH (ref 70–99)
Glucose-Capillary: 153 mg/dL — ABNORMAL HIGH (ref 70–99)
Glucose-Capillary: 162 mg/dL — ABNORMAL HIGH (ref 70–99)
Glucose-Capillary: 214 mg/dL — ABNORMAL HIGH (ref 70–99)
Glucose-Capillary: 234 mg/dL — ABNORMAL HIGH (ref 70–99)

## 2021-10-05 LAB — CBC
HCT: 38.2 % — ABNORMAL LOW (ref 39.0–52.0)
Hemoglobin: 12.6 g/dL — ABNORMAL LOW (ref 13.0–17.0)
MCH: 26.8 pg (ref 26.0–34.0)
MCHC: 33 g/dL (ref 30.0–36.0)
MCV: 81.1 fL (ref 80.0–100.0)
Platelets: 254 10*3/uL (ref 150–400)
RBC: 4.71 MIL/uL (ref 4.22–5.81)
RDW: 15 % (ref 11.5–15.5)
WBC: 37.9 10*3/uL — ABNORMAL HIGH (ref 4.0–10.5)
nRBC: 0.3 % — ABNORMAL HIGH (ref 0.0–0.2)

## 2021-10-05 LAB — LACTIC ACID, PLASMA: Lactic Acid, Venous: >9 mmol/L (ref 0.5–1.9)

## 2021-10-05 LAB — BLOOD GAS, ARTERIAL
Acid-base deficit: 9.4 mmol/L — ABNORMAL HIGH (ref 0.0–2.0)
Bicarbonate: 26 mmol/L (ref 20.0–28.0)
Drawn by: 137461
O2 Saturation: 98.3 %
Patient temperature: 36.2
pCO2 arterial: 120 mmHg (ref 32–48)
pH, Arterial: <6.95 — CL (ref 7.35–7.45)
pO2, Arterial: 253 mmHg — ABNORMAL HIGH (ref 83–108)

## 2021-10-05 LAB — PHOSPHORUS: Phosphorus: >30 mg/dL — ABNORMAL HIGH (ref 2.5–4.6)

## 2021-10-05 LAB — MAGNESIUM: Magnesium: 3.1 mg/dL — ABNORMAL HIGH (ref 1.7–2.4)

## 2021-10-05 LAB — ACETAMINOPHEN LEVEL: Acetaminophen (Tylenol), Serum: <10 ug/mL — ABNORMAL LOW (ref 10–30)

## 2021-10-05 LAB — TRIGLYCERIDES: Triglycerides: 80 mg/dL (ref <150)

## 2021-10-05 LAB — HEPARIN LEVEL (UNFRACTIONATED)
Heparin Unfractionated: 0.56 IU/mL (ref 0.30–0.70)
Heparin Unfractionated: 0.91 IU/mL — ABNORMAL HIGH (ref 0.30–0.70)

## 2021-10-05 LAB — POCT ACTIVATED CLOTTING TIME: Activated Clotting Time: 0 seconds

## 2021-10-05 MED ORDER — CALCIUM GLUCONATE-NACL 2-0.675 GM/100ML-% IV SOLN
2.0000 g | Freq: Once | INTRAVENOUS | Status: AC
  Administered 2021-10-05: 2000 mg via INTRAVENOUS
  Filled 2021-10-05: qty 100

## 2021-10-05 MED ORDER — SODIUM BICARBONATE 8.4 % IV SOLN
200.0000 meq | Freq: Once | INTRAVENOUS | Status: AC
  Administered 2021-10-05: 200 meq via INTRAVENOUS

## 2021-10-05 MED ORDER — SODIUM BICARBONATE 8.4 % IV SOLN
200.0000 meq | Freq: Once | INTRAVENOUS | Status: AC
  Administered 2021-10-05: 200 meq via INTRAVENOUS
  Filled 2021-10-05: qty 200

## 2021-10-05 MED ORDER — SODIUM CHLORIDE 0.9% FLUSH
10.0000 mL | Freq: Two times a day (BID) | INTRAVENOUS | Status: DC
  Administered 2021-10-05 – 2021-10-07 (×5): 10 mL

## 2021-10-05 MED ORDER — PANTOPRAZOLE 2 MG/ML SUSPENSION
40.0000 mg | Freq: Every day | ORAL | Status: DC
  Administered 2021-10-05 – 2021-10-06 (×2): 40 mg
  Filled 2021-10-05 (×3): qty 20

## 2021-10-05 MED ORDER — HEPARIN SODIUM (PORCINE) 5000 UNIT/ML IJ SOLN
5000.0000 [IU] | Freq: Three times a day (TID) | INTRAMUSCULAR | Status: DC
  Administered 2021-10-05 – 2021-10-06 (×3): 5000 [IU] via SUBCUTANEOUS
  Filled 2021-10-05 (×3): qty 1

## 2021-10-05 MED ORDER — SODIUM CHLORIDE 0.9% FLUSH: 10.0000 mL | INTRAVENOUS | Status: DC | PRN

## 2021-10-05 NOTE — Progress Notes (Signed)
Nutrition Brief Note ? ?Chart reviewed d/t vent status. ?Noted grim prognosis and expected hospital death. ?No escalation of care planned.  ?No nutrition interventions indicated at this time. ?Please consult if needed. ? ?Lucas Mallow RD, LDN, CNSC ?Please refer to Amion for contact information.                                                       ? ?

## 2021-10-05 NOTE — Progress Notes (Signed)
? ?NAME:  Eddie Glass, MRN:  [id number], DOB:  1966/01/07, LOS: 1 ?ADMISSION DATE:  09/27/2021, CONSULTATION DATE:  09/17/2021 ?REFERRING MD:  Maryan Rued, CHIEF COMPLAINT:  cardiac arrest  ? ?History of Present Illness:  ?Eddie Glass is a 56 year old male who presented to [facility] ED via EMS after experiencing a cardiac arrest at home earlier this morning. His mother and sister contributed to history however they do not chat with him on a regular basis so have limited information.  ?According to them, he was at home this morning, when he began complaining of shortness to his roommate. EMS was called however he collapsed 1-2 minutes prior to their arrival. Chest compressions were not initiated. He was PEA on arrival of the fire department. Chest compressions were started. He was found to be in asystole on arrival of EMS. ACLS was initiated. He had intermittent periods of ROSC but continued to go back into cardiac arrest. Received 9 total rounds of epi. Total duration reportedly over 45 minutes.  ? ? ?Pertinent  Medical History  ?Asthma ?Hypertension, Hyperlipidemia ?Type 2 DM ? ?Significant Hospital Events: ?Including procedures, antibiotic start and stop dates in addition to other pertinent events   ?3/22 ICU admission for cardiac arrest ?3/23 worsening multiorgan failure ? ?Interim History / Subjective:  ?Intubated/sedated/unresponsive ? ?Objective   ?Blood pressure (!) 113/54, pulse (!) 121, temperature 100 ?F (37.8 ?C), resp. rate (!) 22, height '6\' 2"'$  (1.88 m), weight (!) 140.5 kg, SpO2 97 %. ?   ?Vent Mode: PRVC ?FiO2 (%):  [50 %-100 %] 50 % ?Set Rate:  [30 bmp] 30 bmp ?Vt Set:  [400 mL-500 mL] 500 mL ?PEEP:  [20 cmH20] 20 cmH20 ?Plateau Pressure:  [30 cmH20-40 cmH20] 30 cmH20  ? ?Intake/Output Summary (Last 24 hours) at 09/15/2021 1307 ?Last data filed at 09/16/2021 1100 ?Gross per 24 hour  ?Intake 6926.76 ml  ?Output 215 ml  ?Net 6711.76 ml  ? ? ?Filed Weights  ? 09/14/2021 0756 09/13/2021 0445  ?Weight: 122 kg (!)  140.5 kg  ? ? ?Examination: ?Unresponsive ?GCS3 ?No cough/gag/corneal reflexes ?Pupils pinpoint, unable to determine if reactive ?Lungs clear but diminished at bases, passive on vent ?Ext warm ? ?Despite high minute ventilation on vent and multiple bicarb pushes, still acidemic ? ?Acute liver/kidney injury ?WBC 38k ?Echo poor windows but grossly preserved EF ? ?Resolved Hospital Problem list   ? ? ?Assessment & Plan:  ? ?OOH Respiratory>cardiac arrest (PEA and asystole) ?-total down time suspected to be >45 minutes ?-head CT with loss of grey/white matter differentiation concerning for anoxic injury ?Profound post arrest encephalopathy, shock, renal failure, liver injury ?Acute mixed respiratory failure requiring mechanical ventilation ?-no consolidations or PE on CXR/CTA ?Asthma Exacerbation ?-COVID, Influenza A/B negative ?Mixed respiratory and metabolic acidosis ?-remains refractory but slightly improved ?Elevated INR/PTT ?Type 2 diabetes with hyperglycemia ?Hx of hypertension, hyperlipemia ?DNR ? ?Worsening multiorgan failure. ?Burst suppression on EEG ?Too unstable for MRI nor will it change current management ?Family understands grim prognosis ?Supportive care including nebs/steroids/abx/pressors, no escalation, not HD candidate ?Recheck EKG to see if ST elevations have resolved with improved pH ?In hospital death expected ?Relax visitation ? ?Best Practice (right click and "Reselect all SmartList Selections" daily)  ? ?Diet/type: NPO ?DVT prophylaxis: systemic heparin ?GI prophylaxis: PPI ?Lines: Central line ?Foley:  Yes, and it is still needed ?Code Status:  full code ?Last date of multidisciplinary goals of care discussion [today] ? ?Patient critically ill due to resp failure, shock ?  Interventions to address this today pressor/vent titration ?Risk of deterioration without these interventions is high ? ?I personally spent 34 minutes providing critical care not including any separately billable  procedures ? ?Erskine Emery MD ?[facility] Pulmonary Critical Care ? ?Prefer epic messenger for cross cover needs ?If after hours, please call E-link ? ?

## 2021-10-05 NOTE — Progress Notes (Addendum)
eLink Physician-Brief Progress Note ?Patient Name: Eddie Glass ?DOB: June 10, 1966 ?MRN: [id number] ? ? ?Date of Service ? 10/03/2021  ?HPI/Events of Note ? Received call from radiology re: CT head results. 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    ?eICU Interventions ? 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  ?As per notes, pt is DNR, no escalation of care.  ?Planned for brain death exam in AM.  ? ?Updated mother, Kristine Tiley of the above results, relayed to her that earlier suspicion of edema is confirmed.  ?She was aware that there is no chance of meaningful recovery. She will be back in the morning, and will make further decision of withdrawing care following brain death exam.   ? ? ? 1:55AM ?Pt now in atrial fibrillation in RVR with rates up to 190.  ?Placed order to start amiodarone bolus and infusion.  ? ?[place] ?10/09/2021, 8:02 PM ?

## 2021-10-05 NOTE — Procedures (Addendum)
Patient Name: Eddie Glass  ?MRN: [id number]  ?Epilepsy Attending: Lora Havens  ?Referring Physician/Provider: Dr Zeb Comfort ?Duration: 09/13/2021 2021 to 09/20/2021 2021 ?  ?Patient history: 56yo M s/p cardiac arrest. EEG to evaluate for seizure ?  ?Level of alertness: comatose ?  ?AEDs during EEG study: None ?  ?Technical aspects: This EEG study was done with scalp electrodes positioned according to the 10-20 International system of electrode placement. Electrical activity was acquired at a sampling rate of '500Hz'$  and reviewed with a high frequency filter of '70Hz'$  and a low frequency filter of '1Hz'$ . EEG data were recorded continuously and digitally stored.  ?  ?Description: EEG initially showed burst suppression with bursts of generalized polymorphic sharply contoured 3 to 6 Hz theta-delta slowing lasting 3-5 seconds admixed with generalized spikes alternating with generalized eeg suppression lasting 2-3 seconds. Gradually after around 1300 on 09/20/2021, the periods of suppression became longer lasting about 45 seconds to 1 minute.  After around 1400 on 09/30/2021, EEG showed predominantly generalized EEG suppression which was not reactive to any stimulation.  Hyperventilation and photic stimulation were not performed.    ?  ?ABNORMALITY ?-Spike,generalized ?-Burst suppression, generalized ?  ?IMPRESSION: ?This study initially showed evidence of epileptogenicity with generalized onset.  EEG gradually worsened and after around 1400 on 09/14/2021, EEG was suggestive of profound diffuse encephalopathy.  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 No definite seizures were seen. ? ?EEG appears to be worsening compared to previous day ? ?Bailey Faiella Barbra Sarks  ?  ?

## 2021-10-05 NOTE — Progress Notes (Signed)
EEG now flat. ?Pupils now dilated ?Pressor requirements improved markedly ?Suspect has progressed to brain death. ?If CT head shows edema, this would be consistent. ? ?Additional 60 mins CC time ? ?Erskine Emery MD PCCM ?

## 2021-10-05 NOTE — Progress Notes (Signed)
TIR to check leads and maintenance. Patient had a little skin breakdown under FP1 and it was relocated. Test button was tested.  ?

## 2021-10-05 NOTE — Progress Notes (Signed)
RT transported patient to CT and back with RN. No complications. RT will continue to monitor.  ?

## 2021-10-05 NOTE — Progress Notes (Addendum)
ANTICOAGULATION CONSULT NOTE - Follow Up Consult ? ?Pharmacy Consult for Heparin ?Indication: chest pain/ACS ? ?Allergies  ?Allergen Reactions  ? Other Anaphylaxis  ?  Cats  ? ? ?Patient Measurements: ?Height: '6\' 2"'$  (188 cm) ?Weight: (!) 140.5 kg (309 lb 11.9 oz) ?IBW/kg (Calculated) : 82.2 ?Heparin Dosing Weight: 108.5 kg  ? ?Vital Signs: ?Temp: 100.2 ?F (37.9 ?C) (03/23 0645) ?BP: 83/49 (03/23 0400) ?Pulse Rate: 124 (03/23 0645) ? ?Labs: ?Recent Labs  ?  09/22/2021 ?0724 09/27/2021 ?0734 09/23/2021 ?1036 09/30/2021 ?1752 10/02/2021 ?2131 09/21/2021 ?2310 09/27/2021 ?9518 09/23/2021 ?8416 09/30/2021 ?6063 10/09/2021 ?0160  ?HGB 13.4 13.9  14.3  --    < >  --  13.6 12.6*  --   --  12.6*  ?HCT 44.1 41.0  42.0  --    < >  --  40.0 38.2*  --   --  37.0*  ?PLT 271  --   --   --   --   --  254  --   --   --   ?APTT 59*  --   --   --   --   --   --   --   --   --   ?LABPROT 21.2*  --   --   --   --   --   --   --   --   --   ?INR 1.8*  --   --   --   --   --   --   --   --   --   ?HEPARINUNFRC  --   --   --   --  0.23*  --   --  0.56  --   --   ?CREATININE 2.05* 1.60*  --   --   --   --   --   --  6.02*  --   ?TROPONINIHS 60*  --  4,500*  --   --   --   --   --   --   --   ? < > = values in this interval not displayed.  ? ? ? ?Estimated Creatinine Clearance: 20.7 mL/min (A) (by C-G formula based on SCr of 6.02 mg/dL (H)). ? ? ?Medical History: ?Past Medical History:  ?Diagnosis Date  ? Allergy   ? seasonal  ? Anemia   ? Asthma   ? COVID-19 08/31/2019  ? Hyperlipidemia 07/01/2018  ? ? ? ?Assessment: ?56 yo male admitted on 09/17/2021 with cardiac arrest. Pharmacy consulted for heparin for ACS. Heparin 4000 unit bolus given in ED. CT negative for PE. Troponin 4500.  ? ?Heparin level of 0.56 is therapeutic on heparin 1600 units/hr. Hgb 12.6. Plt wnl. No bleeding noted.  ? ? ?Goal of Therapy:  ?Heparin level 0.3-0.7 units/ml ?Monitor platelets by anticoagulation protocol: Yes ?  ?Plan:  ?Continue heparin 1600 units/hr ?Check 8 hr confirmatory  heparin level ?Monitor heparin level, CBC and s/s of bleeding  ?Follow up cardiology recommendations ? ?Cristela Felt, PharmD, BCPS ?Clinical Pharmacist ?09/28/2021 7:27 AM ? ?Addendum: confirmatory heparin level of 0.91 is supratherapeutic. Level drawn appropriately. No bleeding noted.  ? ?Plan: ?- Decrease heparin to 1500 units/hr  ?- Check 8 hr heparin level  ? ?Cristela Felt, PharmD, BCPS ?Clinical Pharmacist ?09/20/2021 12:40 PM ? ?Addendum: RN reported change in NGT output from green to brown and RN paused heparin. RN discussed with Dr. Tamala Julian and decision made to continue heparin. Will continue to monitor closely.  ? ?Shirlee Limerick  Aris Lot, PharmD, BCPS ?Clinical Pharmacist ?10/10/2021 1:19 PM ? ? ?

## 2021-10-05 NOTE — Progress Notes (Signed)
Updated mother regarding my concerns with his neurological exam.  Discussed we would do formal brain death testing in AM should he survive the night. ? ?Erskine Emery MD PCCM ?

## 2021-10-06 ENCOUNTER — Other Ambulatory Visit (HOSPITAL_COMMUNITY): Payer: Commercial Managed Care - PPO

## 2021-10-06 DIAGNOSIS — R092 Respiratory arrest: Secondary | ICD-10-CM | POA: Diagnosis not present

## 2021-10-06 LAB — BASIC METABOLIC PANEL
Anion gap: 23 — ABNORMAL HIGH (ref 5–15)
BUN: 65 mg/dL — ABNORMAL HIGH (ref 6–20)
CO2: 37 mmol/L — ABNORMAL HIGH (ref 22–32)
Calcium: 5.5 mg/dL — CL (ref 8.9–10.3)
Chloride: 89 mmol/L — ABNORMAL LOW (ref 98–111)
Creatinine, Ser: 8.46 mg/dL — ABNORMAL HIGH (ref 0.61–1.24)
GFR, Estimated: 7 mL/min — ABNORMAL LOW (ref >60)
Glucose, Bld: 261 mg/dL — ABNORMAL HIGH (ref 70–99)
Potassium: 4.1 mmol/L (ref 3.5–5.1)
Sodium: 149 mmol/L — ABNORMAL HIGH (ref 135–145)

## 2021-10-06 LAB — CBC WITH DIFFERENTIAL/PLATELET
Abs Immature Granulocytes: 0.38 10*3/uL — ABNORMAL HIGH (ref 0.00–0.07)
Basophils Absolute: 0 10*3/uL (ref 0.0–0.1)
Basophils Relative: 0 %
Eosinophils Absolute: 0 10*3/uL (ref 0.0–0.5)
Eosinophils Relative: 0 %
HCT: 31.8 % — ABNORMAL LOW (ref 39.0–52.0)
Hemoglobin: 10.8 g/dL — ABNORMAL LOW (ref 13.0–17.0)
Immature Granulocytes: 2 %
Lymphocytes Relative: 3 %
Lymphs Abs: 0.8 10*3/uL (ref 0.7–4.0)
MCH: 26.8 pg (ref 26.0–34.0)
MCHC: 34 g/dL (ref 30.0–36.0)
MCV: 78.9 fL — ABNORMAL LOW (ref 80.0–100.0)
Monocytes Absolute: 0.6 10*3/uL (ref 0.1–1.0)
Monocytes Relative: 2 %
Neutro Abs: 23.4 10*3/uL — ABNORMAL HIGH (ref 1.7–7.7)
Neutrophils Relative %: 93 %
Platelets: 158 10*3/uL (ref 150–400)
RBC: 4.03 MIL/uL — ABNORMAL LOW (ref 4.22–5.81)
RDW: 14.7 % (ref 11.5–15.5)
WBC: 25.2 10*3/uL — ABNORMAL HIGH (ref 4.0–10.5)
nRBC: 1.4 % — ABNORMAL HIGH (ref 0.0–0.2)

## 2021-10-06 LAB — COMPREHENSIVE METABOLIC PANEL
ALT: 806 U/L — ABNORMAL HIGH (ref 0–44)
ALT: 792 U/L — ABNORMAL HIGH (ref 0–44)
AST: 1918 U/L — ABNORMAL HIGH (ref 15–41)
AST: 1711 U/L — ABNORMAL HIGH (ref 15–41)
Albumin: 2.4 g/dL — ABNORMAL LOW (ref 3.5–5.0)
Albumin: 2.4 g/dL — ABNORMAL LOW (ref 3.5–5.0)
Alkaline Phosphatase: 55 U/L (ref 38–126)
Alkaline Phosphatase: 57 U/L (ref 38–126)
Anion gap: 15 (ref 5–15)
Anion gap: 13 (ref 5–15)
BUN: 79 mg/dL — ABNORMAL HIGH (ref 6–20)
BUN: 80 mg/dL — ABNORMAL HIGH (ref 6–20)
CO2: 45 mmol/L — ABNORMAL HIGH (ref 22–32)
CO2: 40 mmol/L — ABNORMAL HIGH (ref 22–32)
Calcium: 5.1 mg/dL — CL (ref 8.9–10.3)
Calcium: 5.9 mg/dL — CL (ref 8.9–10.3)
Chloride: 86 mmol/L — ABNORMAL LOW (ref 98–111)
Chloride: 90 mmol/L — ABNORMAL LOW (ref 98–111)
Creatinine, Ser: 9.75 mg/dL — ABNORMAL HIGH (ref 0.61–1.24)
Creatinine, Ser: 9.74 mg/dL — ABNORMAL HIGH (ref 0.61–1.24)
GFR, Estimated: 6 mL/min — ABNORMAL LOW (ref >60)
GFR, Estimated: 6 mL/min — ABNORMAL LOW (ref >60)
Glucose, Bld: 265 mg/dL — ABNORMAL HIGH (ref 70–99)
Glucose, Bld: 281 mg/dL — ABNORMAL HIGH (ref 70–99)
Potassium: 4.5 mmol/L (ref 3.5–5.1)
Potassium: 4 mmol/L (ref 3.5–5.1)
Sodium: 146 mmol/L — ABNORMAL HIGH (ref 135–145)
Sodium: 143 mmol/L (ref 135–145)
Total Bilirubin: 0.6 mg/dL (ref 0.3–1.2)
Total Bilirubin: 0.6 mg/dL (ref 0.3–1.2)
Total Protein: 5 g/dL — ABNORMAL LOW (ref 6.5–8.1)
Total Protein: 5 g/dL — ABNORMAL LOW (ref 6.5–8.1)

## 2021-10-06 LAB — POCT I-STAT 7, (LYTES, BLD GAS, ICA,H+H)
Acid-Base Excess: 21 mmol/L — ABNORMAL HIGH (ref 0.0–2.0)
Acid-Base Excess: 20 mmol/L — ABNORMAL HIGH (ref 0.0–2.0)
Acid-Base Excess: 20 mmol/L — ABNORMAL HIGH (ref 0.0–2.0)
Acid-Base Excess: 22 mmol/L — ABNORMAL HIGH (ref 0.0–2.0)
Acid-Base Excess: 16 mmol/L — ABNORMAL HIGH (ref 0.0–2.0)
Bicarbonate: 46.5 mmol/L — ABNORMAL HIGH (ref 20.0–28.0)
Bicarbonate: 46.9 mmol/L — ABNORMAL HIGH (ref 20.0–28.0)
Bicarbonate: 50 mmol/L — ABNORMAL HIGH (ref 20.0–28.0)
Bicarbonate: 53.1 mmol/L — ABNORMAL HIGH (ref 20.0–28.0)
Bicarbonate: 47.1 mmol/L — ABNORMAL HIGH (ref 20.0–28.0)
Calcium, Ion: 0.66 mmol/L — CL (ref 1.15–1.40)
Calcium, Ion: 0.68 mmol/L — CL (ref 1.15–1.40)
Calcium, Ion: 0.72 mmol/L — CL (ref 1.15–1.40)
Calcium, Ion: 0.63 mmol/L — CL (ref 1.15–1.40)
Calcium, Ion: 0.81 mmol/L — CL (ref 1.15–1.40)
HCT: 31 % — ABNORMAL LOW (ref 39.0–52.0)
HCT: 31 % — ABNORMAL LOW (ref 39.0–52.0)
HCT: 31 % — ABNORMAL LOW (ref 39.0–52.0)
HCT: 33 % — ABNORMAL LOW (ref 39.0–52.0)
HCT: 31 % — ABNORMAL LOW (ref 39.0–52.0)
Hemoglobin: 10.5 g/dL — ABNORMAL LOW (ref 13.0–17.0)
Hemoglobin: 10.5 g/dL — ABNORMAL LOW (ref 13.0–17.0)
Hemoglobin: 10.5 g/dL — ABNORMAL LOW (ref 13.0–17.0)
Hemoglobin: 11.2 g/dL — ABNORMAL LOW (ref 13.0–17.0)
Hemoglobin: 10.5 g/dL — ABNORMAL LOW (ref 13.0–17.0)
O2 Saturation: 99 %
O2 Saturation: 98 %
O2 Saturation: 98 %
O2 Saturation: 100 %
O2 Saturation: 98 %
Patient temperature: 97.5
Patient temperature: 97.5
Patient temperature: 37
Patient temperature: 97.5
Potassium: 3.4 mmol/L — ABNORMAL LOW (ref 3.5–5.1)
Potassium: 3.5 mmol/L (ref 3.5–5.1)
Potassium: 3.4 mmol/L — ABNORMAL LOW (ref 3.5–5.1)
Potassium: 4.4 mmol/L (ref 3.5–5.1)
Potassium: 4 mmol/L (ref 3.5–5.1)
Sodium: 145 mmol/L (ref 135–145)
Sodium: 145 mmol/L (ref 135–145)
Sodium: 145 mmol/L (ref 135–145)
Sodium: 143 mmol/L (ref 135–145)
Sodium: 144 mmol/L (ref 135–145)
TCO2: 48 mmol/L — ABNORMAL HIGH (ref 22–32)
TCO2: 49 mmol/L — ABNORMAL HIGH (ref 22–32)
TCO2: >50 mmol/L — ABNORMAL HIGH (ref 22–32)
TCO2: >50 mmol/L — ABNORMAL HIGH (ref 22–32)
TCO2: >50 mmol/L — ABNORMAL HIGH (ref 22–32)
pCO2 arterial: 52.6 mmHg — ABNORMAL HIGH (ref 32–48)
pCO2 arterial: 64.4 mmHg — ABNORMAL HIGH (ref 32–48)
pCO2 arterial: 100.2 mmHg (ref 32–48)
pCO2 arterial: 97.3 mmHg (ref 32–48)
pCO2 arterial: 104.5 mmHg (ref 32–48)
pH, Arterial: 7.552 — ABNORMAL HIGH (ref 7.35–7.45)
pH, Arterial: 7.468 — ABNORMAL HIGH (ref 7.35–7.45)
pH, Arterial: 7.306 — ABNORMAL LOW (ref 7.35–7.45)
pH, Arterial: 7.342 — ABNORMAL LOW (ref 7.35–7.45)
pH, Arterial: 7.262 — ABNORMAL LOW (ref 7.35–7.45)
pO2, Arterial: 138 mmHg — ABNORMAL HIGH (ref 83–108)
pO2, Arterial: 95 mmHg (ref 83–108)
pO2, Arterial: 131 mmHg — ABNORMAL HIGH (ref 83–108)
pO2, Arterial: 490 mmHg — ABNORMAL HIGH (ref 83–108)
pO2, Arterial: 126 mmHg — ABNORMAL HIGH (ref 83–108)

## 2021-10-06 LAB — TROPONIN I (HIGH SENSITIVITY)
Troponin I (High Sensitivity): 4254 ng/L (ref <18)
Troponin I (High Sensitivity): 3645 ng/L (ref <18)

## 2021-10-06 LAB — EXPECTORATED SPUTUM ASSESSMENT W GRAM STAIN, RFLX TO RESP C: Special Requests: NORMAL

## 2021-10-06 LAB — CBC
HCT: 32.8 % — ABNORMAL LOW (ref 39.0–52.0)
Hemoglobin: 11.6 g/dL — ABNORMAL LOW (ref 13.0–17.0)
MCH: 26.9 pg (ref 26.0–34.0)
MCHC: 35.4 g/dL (ref 30.0–36.0)
MCV: 76.1 fL — ABNORMAL LOW (ref 80.0–100.0)
Platelets: 199 10*3/uL (ref 150–400)
RBC: 4.31 MIL/uL (ref 4.22–5.81)
RDW: 14.6 % (ref 11.5–15.5)
WBC: 26.1 10*3/uL — ABNORMAL HIGH (ref 4.0–10.5)
nRBC: 1.1 % — ABNORMAL HIGH (ref 0.0–0.2)

## 2021-10-06 LAB — PROTIME-INR
INR: 1.5 — ABNORMAL HIGH (ref 0.8–1.2)
Prothrombin Time: 18.5 seconds — ABNORMAL HIGH (ref 11.4–15.2)

## 2021-10-06 LAB — CK TOTAL AND CKMB (NOT AT ARMC)
CK, MB: 61.1 ng/mL — ABNORMAL HIGH (ref 0.5–5.0)
Relative Index: 0.7 (ref 0.0–2.5)
Total CK: 8579 U/L — ABNORMAL HIGH (ref 49–397)

## 2021-10-06 LAB — TYPE AND SCREEN
ABO/RH(D): O POS
Antibody Screen: NEGATIVE

## 2021-10-06 LAB — BILIRUBIN, DIRECT: Bilirubin, Direct: <0.1 mg/dL (ref 0.0–0.2)

## 2021-10-06 LAB — AMYLASE: Amylase: 490 U/L — ABNORMAL HIGH (ref 28–100)

## 2021-10-06 LAB — GLUCOSE, CAPILLARY
Glucose-Capillary: 250 mg/dL — ABNORMAL HIGH (ref 70–99)
Glucose-Capillary: 232 mg/dL — ABNORMAL HIGH (ref 70–99)
Glucose-Capillary: 217 mg/dL — ABNORMAL HIGH (ref 70–99)
Glucose-Capillary: 239 mg/dL — ABNORMAL HIGH (ref 70–99)
Glucose-Capillary: 289 mg/dL — ABNORMAL HIGH (ref 70–99)

## 2021-10-06 LAB — LACTIC ACID, PLASMA: Lactic Acid, Venous: 1.7 mmol/L (ref 0.5–1.9)

## 2021-10-06 LAB — APTT: aPTT: 31 seconds (ref 24–36)

## 2021-10-06 LAB — MAGNESIUM: Magnesium: 2.9 mg/dL — ABNORMAL HIGH (ref 1.7–2.4)

## 2021-10-06 LAB — PHOSPHORUS: Phosphorus: 11.4 mg/dL — ABNORMAL HIGH (ref 2.5–4.6)

## 2021-10-06 LAB — ABO/RH: ABO/RH(D): O POS

## 2021-10-06 LAB — LIPASE, BLOOD: Lipase: 38 U/L (ref 11–51)

## 2021-10-06 MED ORDER — VANCOMYCIN HCL 1500 MG/300ML IV SOLN
1500.0000 mg | INTRAVENOUS | Status: AC
  Administered 2021-10-06: 1500 mg via INTRAVENOUS
  Filled 2021-10-06: qty 300

## 2021-10-06 MED ORDER — DEXTROSE 50 % IV SOLN
50.0000 mL | Freq: Once | INTRAVENOUS | Status: AC
  Administered 2021-10-06: 50 mL via INTRAVENOUS
  Filled 2021-10-06: qty 50

## 2021-10-06 MED ORDER — LEVOTHYROXINE SODIUM 100 MCG/5ML IV SOLN
20.0000 ug | Freq: Once | INTRAVENOUS | Status: AC
  Administered 2021-10-06: 20 ug via INTRAVENOUS
  Filled 2021-10-06: qty 5

## 2021-10-06 MED ORDER — INSULIN REGULAR(HUMAN) IN NACL 100-0.9 UT/100ML-% IV SOLN
INTRAVENOUS | Status: DC
  Administered 2021-10-06: 6 [IU]/h via INTRAVENOUS
  Filled 2021-10-06: qty 100

## 2021-10-06 MED ORDER — AMIODARONE HCL IN DEXTROSE 360-4.14 MG/200ML-% IV SOLN
30.0000 mg/h | INTRAVENOUS | Status: DC
  Administered 2021-10-06 – 2021-10-07 (×4): 30 mg/h via INTRAVENOUS
  Filled 2021-10-06: qty 200

## 2021-10-06 MED ORDER — INSULIN ASPART 100 UNIT/ML IJ SOLN
20.0000 [IU] | Freq: Once | INTRAMUSCULAR | Status: AC
  Administered 2021-10-06: 20 [IU] via SUBCUTANEOUS

## 2021-10-06 MED ORDER — SODIUM CHLORIDE 0.9 % IV SOLN: INTRAVENOUS | Status: DC

## 2021-10-06 MED ORDER — DEXTROSE 50 % IV SOLN: 0.0000 mL | INTRAVENOUS | Status: DC | PRN

## 2021-10-06 MED ORDER — AMIODARONE HCL IN DEXTROSE 360-4.14 MG/200ML-% IV SOLN
INTRAVENOUS | Status: AC
  Administered 2021-10-06: 60 mg/h via INTRAVENOUS
  Filled 2021-10-06: qty 200

## 2021-10-06 MED ORDER — SODIUM CHLORIDE 0.9 % IV SOLN
1.0000 g | Freq: Once | INTRAVENOUS | Status: AC
  Administered 2021-10-06: 1 g via INTRAVENOUS
  Filled 2021-10-06: qty 1

## 2021-10-06 MED ORDER — AMIODARONE LOAD VIA INFUSION
150.0000 mg | Freq: Once | INTRAVENOUS | Status: AC
  Administered 2021-10-06: 150 mg via INTRAVENOUS
  Filled 2021-10-06: qty 83.34

## 2021-10-06 MED ORDER — SODIUM CHLORIDE 0.9 % IV SOLN
2.0000 g | Freq: Once | INTRAVENOUS | Status: AC
  Administered 2021-10-06: 2 g via INTRAVENOUS
  Filled 2021-10-06: qty 20

## 2021-10-06 MED ORDER — SODIUM CHLORIDE 0.9 % IV SOLN
2000.0000 mg | Freq: Once | INTRAVENOUS | Status: AC
  Administered 2021-10-06: 2000 mg via INTRAVENOUS
  Filled 2021-10-06: qty 32

## 2021-10-06 MED ORDER — SODIUM CHLORIDE 0.9 % IV SOLN: INTRAVENOUS | Status: DC | PRN

## 2021-10-06 MED ORDER — SODIUM CHLORIDE 0.9 % IV SOLN
3.0000 g | Freq: Once | INTRAVENOUS | Status: AC
  Administered 2021-10-06: 3 g via INTRAVENOUS
  Filled 2021-10-06: qty 30

## 2021-10-06 MED ORDER — AMIODARONE HCL IN DEXTROSE 360-4.14 MG/200ML-% IV SOLN
60.0000 mg/h | INTRAVENOUS | Status: AC
Start: 2021-10-06 — End: 2021-10-06
  Administered 2021-10-06: 60 mg/h via INTRAVENOUS
  Filled 2021-10-06: qty 200

## 2021-10-06 MED ORDER — DILTIAZEM HCL-DEXTROSE 125-5 MG/125ML-% IV SOLN (PREMIX)
5.0000 mg/h | INTRAVENOUS | Status: DC
  Administered 2021-10-06: 5 mg/h via INTRAVENOUS
  Filled 2021-10-06: qty 125

## 2021-10-06 MED ORDER — SODIUM CHLORIDE 0.9 % IV SOLN
10.0000 ug/h | INTRAVENOUS | Status: DC
  Administered 2021-10-06: 10 ug/h via INTRAVENOUS
  Administered 2021-10-07: 15 ug/h via INTRAVENOUS
  Filled 2021-10-06 (×2): qty 10

## 2021-10-06 MED ORDER — SODIUM CHLORIDE 0.9 % IV BOLUS
1000.0000 mL | Freq: Once | INTRAVENOUS | Status: AC
  Administered 2021-10-06: 1000 mL via INTRAVENOUS

## 2021-10-06 MED ORDER — DILTIAZEM LOAD VIA INFUSION
10.0000 mg | Freq: Once | INTRAVENOUS | Status: AC
  Administered 2021-10-06: 10 mg via INTRAVENOUS
  Filled 2021-10-06: qty 10

## 2021-10-06 MED ORDER — "THROMBI-PAD 3""X3"" EX PADS"
1.0000 | MEDICATED_PAD | Freq: Once | CUTANEOUS | Status: AC
  Administered 2021-10-06: 1 via TOPICAL
  Filled 2021-10-06: qty 1

## 2021-10-06 MED ORDER — DIGOXIN 0.25 MG/ML IJ SOLN
0.2500 mg | Freq: Once | INTRAMUSCULAR | Status: DC | PRN
  Filled 2021-10-06: qty 1

## 2021-10-06 MED ORDER — DILTIAZEM LOAD VIA INFUSION
10.0000 mg | Freq: Once | INTRAVENOUS | Status: DC | PRN
  Filled 2021-10-06: qty 10

## 2021-10-06 NOTE — Progress Notes (Signed)
LTM maint complete - no skin breakdown under: Fp1 Fp2  ?Ground moved from forehead to scalp ?Head netting repositioned  ?.serviced Fp1 Fp2 T8 A2 F7 F3 ?[facility] monitored, Event button test confirmed by [facility]. ?  ?

## 2021-10-06 NOTE — Progress Notes (Signed)
? ? ?  Pt was seen by Dr. Burt Knack 2 days ago following cardiac arrest. ?Has been declared brain dead  ? ?The PCCM team has been asked by the [facility] team to have cardiolology see him again to  ?  address his LV function with TEE  ?Assess/ manage his atrial fib  ? ?He is currently on amio  ?Vent support ?  ?Will have the rounding team see him again on Saturday and follow  ? ? ? ?Mertie Moores, MD  ?09/30/2021 5:40 PM    ?Welcome ?[address],  [address] ?[place], [place]  [address] ?Phone: [phone]; Fax: [phone]  ? ?

## 2021-10-06 NOTE — Procedures (Addendum)
Adult Brain Death Determination ? ?Time of Examination: 09/23/2021 10:02AM ? ?No Evidence of /Cause of Reversible CNS Depression  ?Core temperature must be greater >36 degrees. Last temp: Temp: 99.5 ?F (37.5 ?C) (Note: If unable to achieve normothermia after 12 hours of temperature management, may consider proceeding with Brain Death Evaluation.):    yes ? ?Evidence of severe metabolic perturbations that could potentate CNS depression. Consider glucose, Na, creatinine, PaCO2, SaO2.:    Absent ? ?Evidence of drugs, by history or measurement, that could potentiate central nervous system depression: narcotics, ethanol, benzodiazepines, barbiturates, neuromuscular blockade.:     Absent ? ?Absence of Cortical Function  ?GCS = 3:    yes ? ?Absence of Brain Stem Reflexes and Responses  ?Pupils light-fixed    yes ? ?Corneal reflexes:    Absent ? ?Response to upper and lower airway stimulation, such as pharyngeal and endotracheal suctioning.:    Absent ? ?Ocular response to head turning (eye movement).    Absent ? ?Absence of Spontaneous Respirations  (Apnea test performed per Brain Death Policy. If not met due to hemodynamic/ventilatory instability, then perform EEG, TCD, or cerebral blow flow studies.)  ?1.   Spontaneous Respirations   Absent ? ?2.   PaCO2 at start of apnea test:  64 ? ?3.   PaCO2 at end of apnea test:  100 ? ?4.   CO2 rise of 20 or greater from baseline:   yes  ?Document Confirmatory Test Utilized: (Optional) ?Nuclear cerebral flow, cerebral angiography (CT/MR angio), transcranial Doppler ultrasound, EEG, SSEP (record results).  ?Test results (if available):  CT showing brain herniation October 08, 2022 ? ?Patient pronounced dead by neurological criteria at 10:02AM on 09/27/2021. ? ?Eddie Furbish, MD ?10/06/2021 ?10:04 AM ? ? ?

## 2021-10-06 NOTE — Progress Notes (Signed)
LTM EEG discontinued - no skin breakdown at unhook.   

## 2021-10-06 NOTE — Progress Notes (Signed)
? ?NAME:  Eddie Glass, MRN:  [id number], DOB:  March 26, 1966, LOS: 2 ?ADMISSION DATE:  10/03/2021, CONSULTATION DATE:  10/02/2021 ?REFERRING MD:  Maryan Rued, CHIEF COMPLAINT:  cardiac arrest  ? ?History of Present Illness:  ?Eddie Glass is a 56 year old male who presented to [facility] ED via EMS after experiencing a cardiac arrest at home earlier this morning. His mother and sister contributed to history however they do not chat with him on a regular basis so have limited information.  ?According to them, he was at home this morning, when he began complaining of shortness to his roommate. EMS was called however he collapsed 1-2 minutes prior to their arrival. Chest compressions were not initiated. He was PEA on arrival of the fire department. Chest compressions were started. He was found to be in asystole on arrival of EMS. ACLS was initiated. He had intermittent periods of ROSC but continued to go back into cardiac arrest. Received 9 total rounds of epi. Total duration reportedly over 45 minutes.  ? ? ?Pertinent  Medical History  ?Asthma ?Hypertension, Hyperlipidemia ?Type 2 DM ? ?Significant Hospital Events: ?Including procedures, antibiotic start and stop dates in addition to other pertinent events   ?3/22 ICU admission for cardiac arrest ?3/23 worsening multiorgan failure ?10-24-22 brain death declared ? ?Interim History / Subjective:  ?Went into afib RVR overnight. Placed on amio. ? ?Objective   ?Blood pressure 111/68, pulse (!) 131, temperature 98.4 ?F (36.9 ?C), resp. rate (!) 0, height '6\' 2"'$  (1.88 m), weight (!) 142.4 kg, SpO2 92 %. ?   ?Vent Mode: PRVC ?FiO2 (%):  [50 %] 50 % ?Set Rate:  [16 bmp-30 bmp] 16 bmp ?Vt Set:  [500 mL] 500 mL ?PEEP:  [16 cmH20-20 cmH20] 16 cmH20 ?Plateau Pressure:  [29 cmH20-31 cmH20] 30 cmH20  ? ?Intake/Output Summary (Last 24 hours) at 10-23-2021 1049 ?Last data filed at 2021-10-23 1000 ?Gross per 24 hour  ?Intake 6321.7 ml  ?Output 415 ml  ?Net 5906.7 ml  ? ? ?Filed Weights  ? 09/13/2021  0756 10/13/2021 0445 10-23-21 0500  ?Weight: 122 kg (!) 140.5 kg (!) 142.4 kg  ? ? ?Examination: ?General: unresponsive ?Cardiac: tachycardic, irregular rhythm ?Pulm: on full vent support, mechanical breath sounds ?Neuro: unresponsive to voice/physical stimuli. Pupils dilated, non-responsive to light. Fixed gaze. No corneal reflex. No gag/cough. Does not respond to painful stimuli.  ? ?EEG flat ?Apnea test, ABG: pCO2 64>100 ? ?Resolved Hospital Problem list   ? ? ?Assessment & Plan:  ? ?OOH Respiratory>cardiac arrest (PEA and asystole) ?-total down time suspected to be >45 minutes ?-Initial head CT, 3/23, with loss of grey/white matter differentiation concerning for anoxic injury ?-Repeat head CT 10-24-2022 showed diffuse edema with herniation of cerebellar tonsils.  ?Profound post arrest encephalopathy, shock, renal failure, liver injury ?Acute mixed respiratory failure requiring mechanical ventilation ?-no consolidations or PE on CXR/CTA ?Status asthmaticus ?-COVID, Influenza A/B negative ?Mixed respiratory and metabolic acidosis ?-remains refractory but slightly improved ?Elevated INR/PTT ?Type 2 diabetes with hyperglycemia ?Hx of hypertension, hyperlipemia ?Afib RVR Oct 24, 2022) ?DNR ? ?Testing confirms brain death; see separate note for details. Discussed with family. Death summary to follow.  ? ?Best Practice (right click and "Reselect all SmartList Selections" daily)  ? ?Diet/type: NPO ?DVT prophylaxis: systemic heparin ?GI prophylaxis: PPI ?Lines: Central line ?Foley:  Yes, and it is still needed ?Code Status:  full code ?Last date of multidisciplinary goals of care discussion [today] ? ?[name], MD ?Internal Medicine Resident PGY-3 ?[facility]  Internal Medicine Residency ?Pager: [phone] ?10/12/2021 10:58 AM  ?  ?

## 2021-10-06 NOTE — Progress Notes (Signed)
Apnea test was initiated with MD x2 and RT x3 at bedside. Pt was taken off ventilator and put on 8L O2 tubing down ETT. No respiratory effort noted. ABG taken after 8 mins of test. Pt put back on full support. ?

## 2021-10-06 NOTE — Progress Notes (Addendum)
eLink Physician-Brief Progress Note ?Patient Name: Eddie Glass ?DOB: Nov 07, 1965 ?MRN: [id number] ? ? ?Date of Service ? 10/08/2021  ?HPI/Events of Note ? Cardiac arrest, prolonged code > 55 mins. STEMI. No brain stem reflexes. DNR. ? ?[facility] requests order for STAT CT of abdomen , pelvis, and chest without contrast ? ?RN requests order for thrombi pad for oozing central line site  ?eICU Interventions ? ordering  ? ? ? ?Intervention Category ?Intermediate Interventions: Other: (ABI) ? ?Elmer Sow ?09/18/2021, 7:32 PM ? ?7:47 PM ?Also [facility] wants 1L NS bolus, 2 amps calcium chloride, 2g Cefepime X1, Insulin gtt. ?They also consulted cardiology and cards wrote a note saying they will see in the AM but [facility] wants them tonight for control of Afib. Already on Amio and cardzem gtt.  ? ?Discussed with RN.  ?Camera evaluation done. ?Above orders enetered. ? ?Discussed with Organ donation RN at length. ?Ground team CCM updated via secure chat for assessment for Cardioversion. Get BMP, ABG stat. ?As per Organ donation, now he is full code for donation purpose.   ? ?20:33 ? ?Called Cardiology on call, updated HR issues. ?He is recommending to give amiodarone boluses for now, if persisting a fib RVR to go for cardioversion. ?Dig also ordered prn if amiodarone 150 mg bolus not working. ? ?Ground team CCM notified. ? ?

## 2021-10-06 NOTE — TOC Progression Note (Signed)
Transition of Care (TOC) - Progression Note  ? ? ?Patient Details  ?Name: MARQUAVIUS SCAIFE ?MRN: [id number] ?Date of Birth: February 20, 1966 ? ?Transition of Care (TOC) CM/SW Contact  ?Angelita Ingles, RN ?Phone Number:[phone] ? ?09/27/2021, 2:28 PM ? ?Clinical Narrative:    ? ?Transition of Care (TOC) Screening Note ? ? ?Patient Details  ?Name: DJUAN TALTON ?Date of Birth: 1965/12/27 ? ? ?Transition of Care (TOC) CM/SW Contact:    ?Angelita Ingles, RN ?Phone Number: ?09/17/2021, 2:29 PM ? ? ? ?Transition of Care Department [facility]) has reviewed patient and no TOC needs have been identified at this time. We will continue to monitor patient advancement through interdisciplinary progression rounds. If new patient transition needs arise, please place a TOC consult. ? ? ? ? ?  ?  ? ?Expected Discharge Plan and Services ?  ?  ?  ?  ?  ?                ?  ?  ?  ?  ?  ?  ?  ?  ?  ?  ? ? ?Social Determinants of Health (SDOH) Interventions ?  ? ?Readmission Risk Interventions ?   ? View : No data to display.  ?  ?  ?  ? ? ?

## 2021-10-06 NOTE — Progress Notes (Signed)
Pts mother called and stated that she wished for pt to become a Donor.  Informed [facility].  Will continue plan of care.  Irven Baltimore, RN ? ?

## 2021-10-06 NOTE — Progress Notes (Signed)
Remains in RVR with rates 150s-160s on amiodarone. Will start cardizem gtt and adjust pressors as needed. ? ?[name], MD ?

## 2021-10-06 NOTE — Progress Notes (Signed)
Spoke with Randalyn Rhea ME and pt is not ME case.  Irven Baltimore, RN ? ?

## 2021-10-06 NOTE — Progress Notes (Signed)
0140: Pt heart monitor alarming for high rate, HR noted to be irregular with rate 170-190s. 12 lead EKG done confirming Afib with RVR. Spoke to [employer]. Levophed requirements trending down overnight. ? ?0155: New orders placed by [facility] MD for Amio drip and bolus. ? ?0230: Amio drip started, bolus given. However, pts remains in Afib with RVR, HR 150-170's, occasionally HR bounces to 190s but trends back down. AM labs drawn. Reached out to [facility] with update. ? ?0243: Critical Calcium of 5.5 called into [facility]. ?

## 2021-10-06 NOTE — Procedures (Addendum)
Patient Name: BRONISLAW SWITZER  ?MRN: [id number]  ?Epilepsy Attending: Lora Havens  ?Referring Physician/Provider: Dr Zeb Comfort ?Duration: 10/08/2021 2021 to 09/24/2021 1314 ?  ?Patient history: 56yo M s/p cardiac arrest. EEG to evaluate for seizure ?  ?Level of alertness: comatose ?  ?AEDs during EEG study: None ?  ?Technical aspects: This EEG study was done with scalp electrodes positioned according to the 10-20 International system of electrode placement. Electrical activity was acquired at a sampling rate of '500Hz'$  and reviewed with a high frequency filter of '70Hz'$  and a low frequency filter of '1Hz'$ . EEG data were recorded continuously and digitally stored.  ?  ?Description: EEG showed continuous generalized EEG suppression which was not reactive to any stimulation.  Hyperventilation and photic stimulation were not performed.    ?  ?ABNORMALITY ?-Background suppression, generalized ?  ?IMPRESSION: ?This study is suggestive of profound diffuse encephalopathy.  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 No definite seizures were seen. ?  ?Lora Havens  ?

## 2021-10-06 NOTE — Progress Notes (Signed)
Pt transport 2M09 to CT 1 and  back by RN and RT with no complications. ?

## 2021-10-07 DIAGNOSIS — R092 Respiratory arrest: Secondary | ICD-10-CM | POA: Diagnosis not present

## 2021-10-07 LAB — POCT I-STAT 7, (LYTES, BLD GAS, ICA,H+H)
Acid-Base Excess: 17 mmol/L — ABNORMAL HIGH (ref 0.0–2.0)
Bicarbonate: 45.8 mmol/L — ABNORMAL HIGH (ref 20.0–28.0)
Calcium, Ion: 0.74 mmol/L — CL (ref 1.15–1.40)
HCT: 31 % — ABNORMAL LOW (ref 39.0–52.0)
Hemoglobin: 10.5 g/dL — ABNORMAL LOW (ref 13.0–17.0)
O2 Saturation: 96 %
Patient temperature: 35.7
Potassium: 4.1 mmol/L (ref 3.5–5.1)
Sodium: 143 mmol/L (ref 135–145)
TCO2: 48 mmol/L — ABNORMAL HIGH (ref 22–32)
pCO2 arterial: 80.7 mmHg (ref 32–48)
pH, Arterial: 7.356 (ref 7.35–7.45)
pO2, Arterial: 83 mmHg (ref 83–108)

## 2021-10-07 LAB — GLUCOSE, CAPILLARY
Glucose-Capillary: 182 mg/dL — ABNORMAL HIGH (ref 70–99)
Glucose-Capillary: 186 mg/dL — ABNORMAL HIGH (ref 70–99)
Glucose-Capillary: 204 mg/dL — ABNORMAL HIGH (ref 70–99)
Glucose-Capillary: 217 mg/dL — ABNORMAL HIGH (ref 70–99)
Glucose-Capillary: 242 mg/dL — ABNORMAL HIGH (ref 70–99)
Glucose-Capillary: 257 mg/dL — ABNORMAL HIGH (ref 70–99)
Glucose-Capillary: 257 mg/dL — ABNORMAL HIGH (ref 70–99)
Glucose-Capillary: 144 mg/dL — ABNORMAL HIGH (ref 70–99)
Glucose-Capillary: 149 mg/dL — ABNORMAL HIGH (ref 70–99)
Glucose-Capillary: 160 mg/dL — ABNORMAL HIGH (ref 70–99)
Glucose-Capillary: 141 mg/dL — ABNORMAL HIGH (ref 70–99)

## 2021-10-08 LAB — CULTURE, RESPIRATORY W GRAM STAIN
Culture: NORMAL
Special Requests: NORMAL

## 2021-10-08 LAB — CALCIUM, IONIZED: Calcium, Ionized, Serum: <3 mg/dL — ABNORMAL LOW (ref 4.5–5.6)

## 2021-10-09 LAB — CULTURE, BLOOD (ROUTINE X 2)
Culture: NO GROWTH
Culture: NO GROWTH
Special Requests: ADEQUATE
Special Requests: ADEQUATE

## 2021-10-14 NOTE — Death Summary Note (Signed)
Patient ID: ?Eddie Glass ?MRN: [id number] ?DOB/AGE: July 27, 1965 56 y.o. ? ?Admit date: 10-24-2021 ?Death date: 26-Oct-2021 ? ?Admission Diagnoses:  ?Respiratory arrest ?Out of hospital PEA cardiac arrest ?Status asthmaticus ?Acute hypoxic and hypercapnic respiratory failure ?Mixed respiratory and metabolic acidosis ?Anoxic brain injury ?Acute kidney failure ?Shock liver with coagulopathy ?Non-displaced rib fractures due to CPR ?History of hypertension ?History of hyperlipidemia ?Type 2 Diabetes Mellitus ? ?Cause of Death:  Anoxic brain injury  ? ?Pertinent Medical Diagnosis: ?Anoxic brain injury ?Brain herniation ?Brain death ? ? ?Hospital Course:  ?Eddie Glass was a 56 year old gentleman admitted 10/24/21 after experiencing an out of hospital PEA arrest complicated by repeat cardiac arrests. Total down time estimated at >45 minutes. It is suspected that respiratory arrest from status asthmaticus preceded cardiac arrest. Unfortunately, head imaging on admission was already concerning for anoxic injury with loss of white/grey matter differentiation. He underwent a repeat head CT on hospital day #1 which revealed herniation of the cerebellar tonsils. Brain death testing was completed on 2021/10/26 and he was declared deceased at 10:02 AM.  ? ?Signed: ?[name], MD ?Internal Medicine Resident PGY-2 ?[facility] ?Pager: [phone] ?October 26, 2021 12:16 PM  ?   ?

## 2021-10-14 NOTE — Progress Notes (Signed)
0030: Sports coach at bedside. Updated that pt has been medical ruled out for organ donation. ? ?0037: [facility] called and made aware of changes. Coordinator reports that family plans to be at bedside in am around 1000. [place] [facility] will be back prior to family arriving to support family. [facility] reports continuing current medical treatments until AM when family arrives. However, AM labs can be discontinued. Maintenance fluids changed to 50cc/hr per [facility]. ? ?Coordinator reports [facility] should be called back with a cardiac time of death, due to pt still being possible tissue/eye donor. ?

## 2021-10-14 NOTE — Progress Notes (Signed)
10/18/21 ? ? ?I have seen and evaluated the patient for post brain death care. ? ?S:  ?No events. ?Ruled out for donation. ?[place] to speak with family around [place]. ? ?O: ?Blood pressure (!) 85/63, pulse (!) 133, temperature 97.9 ?F (36.6 ?C), resp. rate 20, height '6\' 2"'$  (1.88 m), weight (!) 141.8 kg, SpO2 92 %.  ?Comatose ?Fixed pupils ?GCS3 ?Lungs with rhonci ?Ext warm ? ?A:  ?Brain death after prolonged OHCA ? ?P:  ?Continue supportive care ?[place] to speak with family then we will extubate when all have said goodbye ? ? ?Erskine Emery MD ?[facility] ?Prefer epic messenger for cross cover needs ?If after hours, please call [facility] ? ?

## 2021-10-14 NOTE — Progress Notes (Signed)
Pt compassionately extubated per MD order with RRT and RN at bedside. ?

## 2021-10-14 DEATH — deceased

## 2022-01-18 ENCOUNTER — Other Ambulatory Visit: Payer: Self-pay | Admitting: Family Medicine

## 2022-03-24 IMAGING — CT CT CHEST-ABD-PELV W/O CM
2 of 4 series · 14 of 46 positions shown, 16 images · non-contrast
Comparison: 10/04/2021

CLINICAL DATA: Anoxic brain injury, organ donor



[Series 3: cap without · axial · non-contrast · 0.96mm/px · z∈[+786,+1396]mm · 11 of 144 slices shown, 13 images]
[im 11/144  soft-tissue]
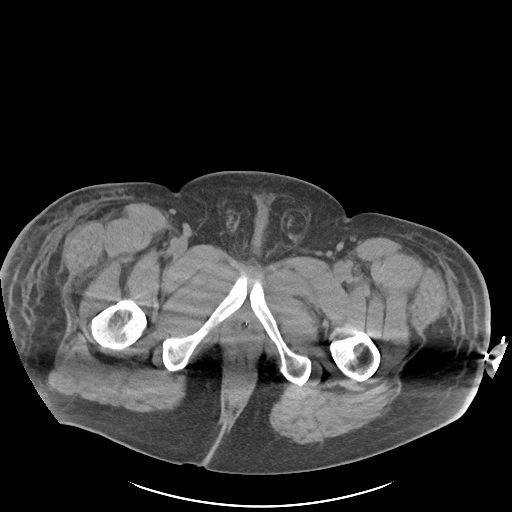
[im 11/144  bone]
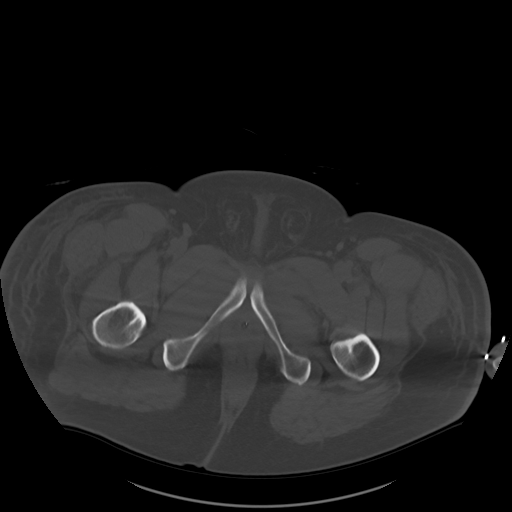
[im 21/144  soft-tissue]
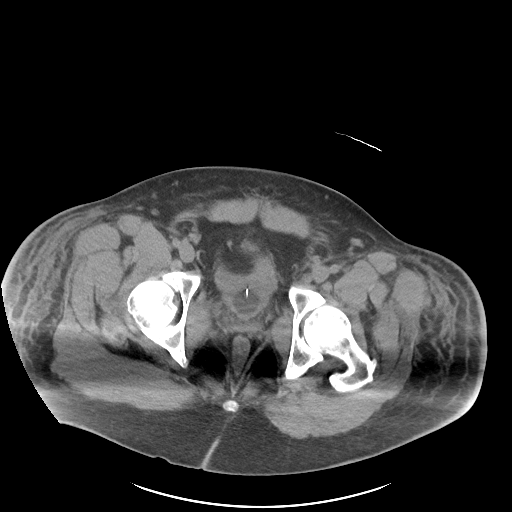
[im 31/144  soft-tissue]
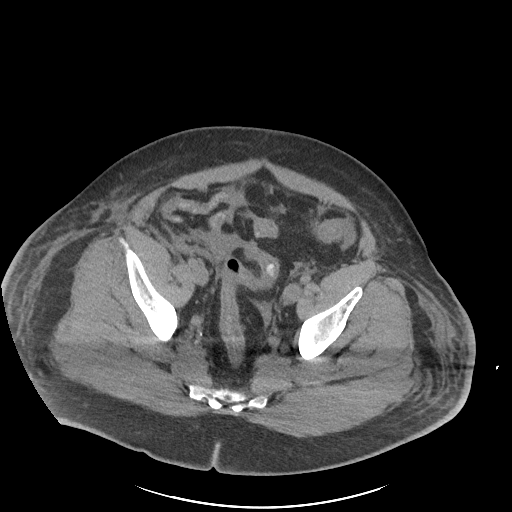
[im 52/144  soft-tissue]
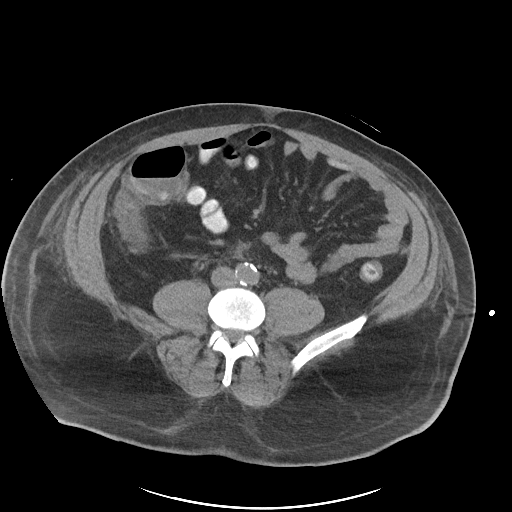
[im 62/144  soft-tissue]
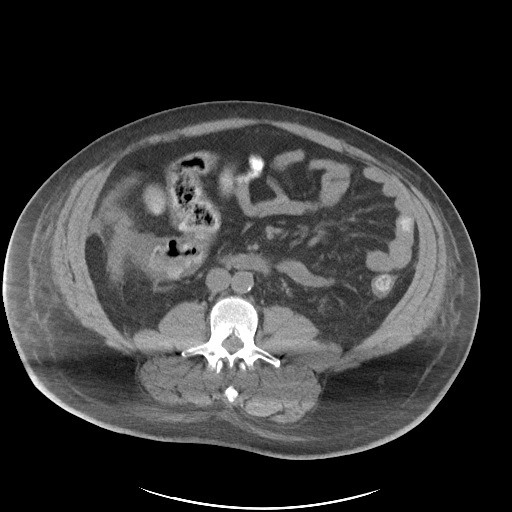
[im 72/144  soft-tissue]
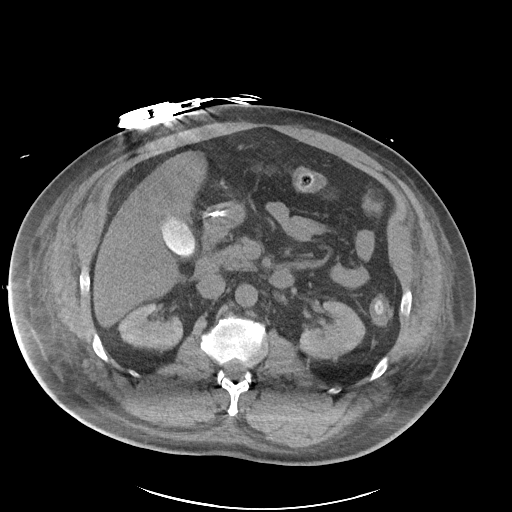
[im 82/144  soft-tissue]
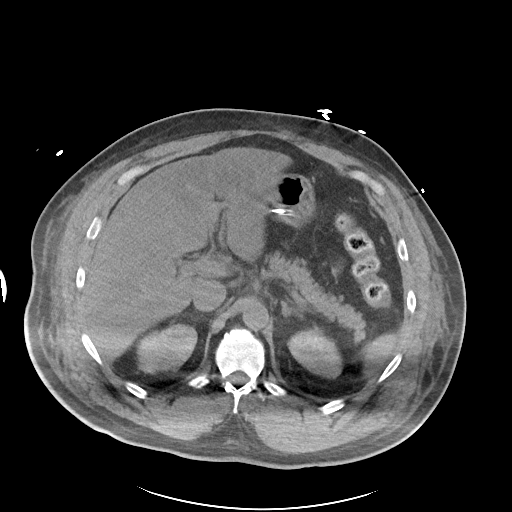
[im 92/144  soft-tissue]
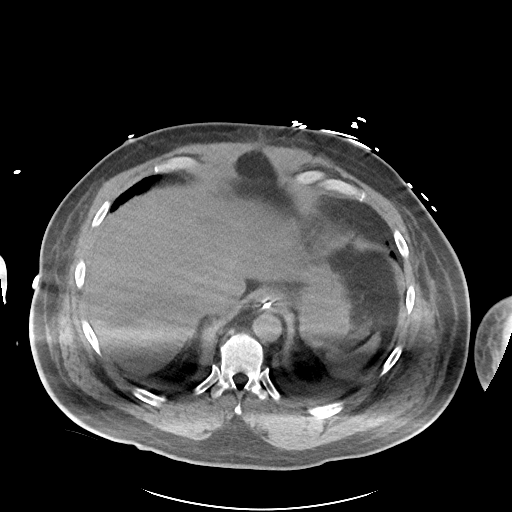
[im 113/144  soft-tissue]
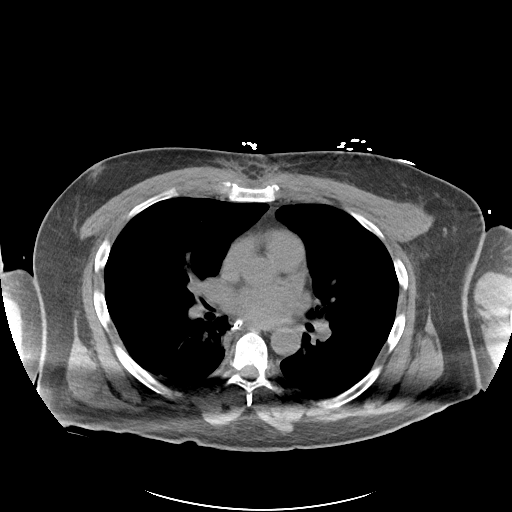
[im 113/144  bone]
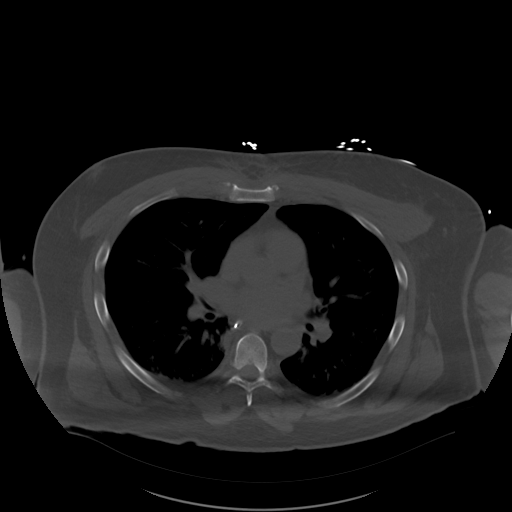
[im 123/144  soft-tissue]
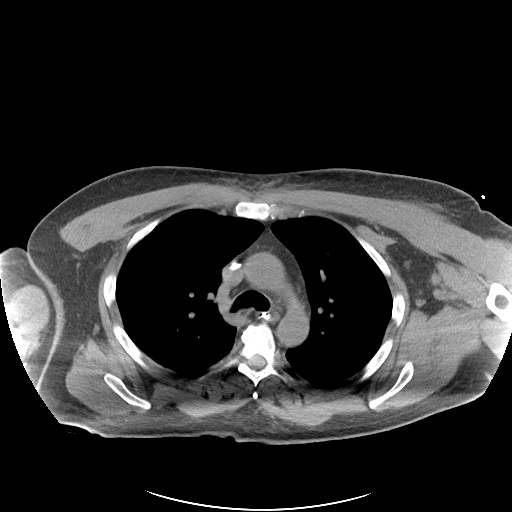
[im 133/144  soft-tissue]
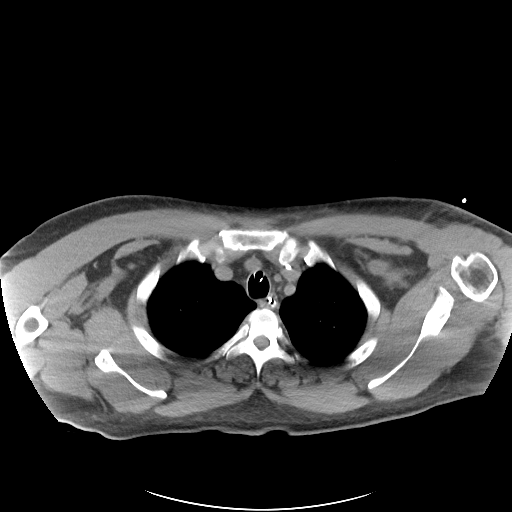

[Series 6: cor · coronal · 1.03mm/px · 3 of 125 slices shown]
[im 42/125  soft-tissue]
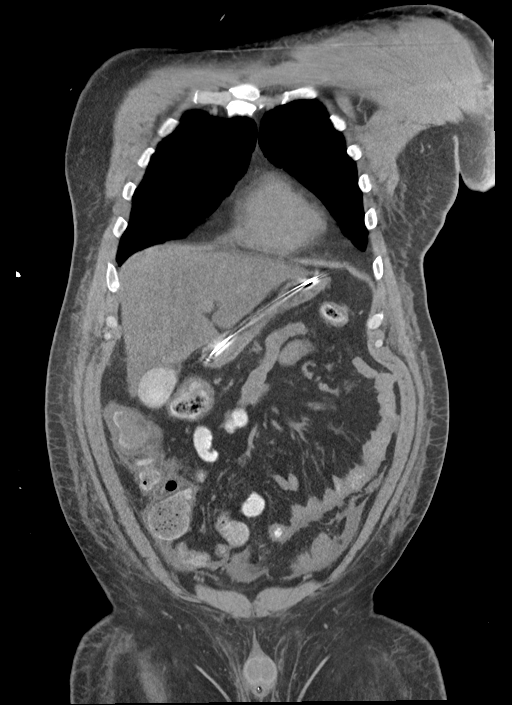
[im 56/125  soft-tissue]
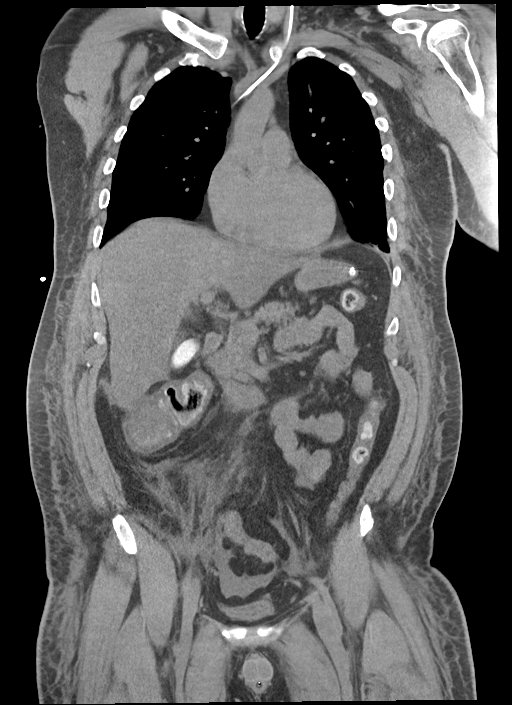
[im 69/125  soft-tissue]
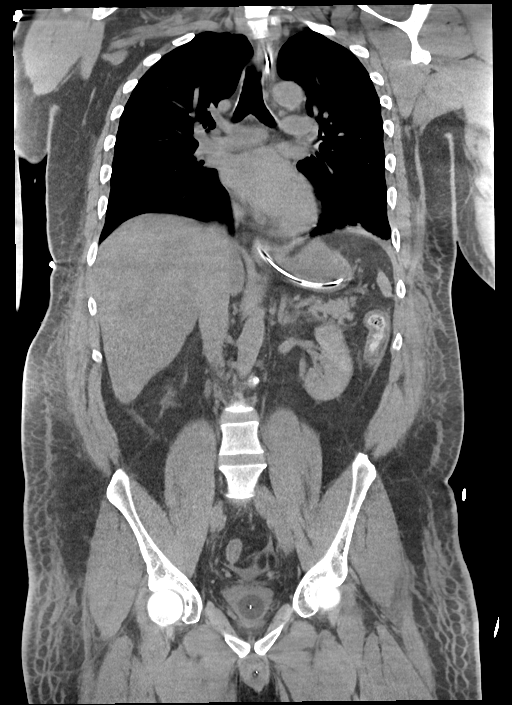

[14 of 46 positions shown; findings below may reference images not displayed]

FINDINGS: CT CHEST FINDINGS

Cardiovascular: Limited unenhanced imaging of the heart and great
vessels demonstrates no pericardial effusion. Normal caliber of the
thoracic aorta. Central venous catheter via left internal jugular
approach tip within the superior vena cava.

Mediastinum/Nodes: Endotracheal tube well above carina. Enteric
catheter extends into the gastric lumen. Thyroid is unremarkable. No
pathologic adenopathy.

Lungs/Pleura: Dependent hypoventilatory changes are seen within the
lower lobes. Trace bilateral pleural effusions. No airspace disease
or pneumothorax. Central airways are patent.

Musculoskeletal: No acute or destructive bony lesions. Mild
subcutaneous edema overlying the sternum could be related to prior
resuscitation efforts. Reconstructed images demonstrate no
additional findings.

CT ABDOMEN PELVIS FINDINGS

Hepatobiliary: High attenuation material within the gallbladder
consistent with vicarious excretion of previously administered
contrast. Heterogeneous decreased attenuation of the liver
consistent with hepatic steatosis. No biliary duct dilation.

Pancreas: Unremarkable unenhanced appearance.

Spleen: Unremarkable unenhanced appearance.

Adrenals/Urinary Tract: Residual contrast within the renal
parenchyma from prior contrasted CT exam, consistent with renal
dysfunction. No urinary tract calculi or obstructive uropathy. The
bladder is decompressed with a Foley catheter in place. The adrenals
are unremarkable.

Stomach/Bowel: No bowel obstruction or ileus. Mural thickening of
the cecum and ascending colon. Normal appendix right lower quadrant.

Vascular/Lymphatic: Mild aortic atherosclerosis. No pathologic
adenopathy within the abdomen or pelvis.

Reproductive: Prostate is unremarkable.

Other: Trace free fluid within the bilateral paracolic gutters. No
free intraperitoneal gas. No abdominal wall hernia.

Musculoskeletal: Subcutaneous edema within the bilateral flanks and
lower abdominal wall. No acute or destructive bony lesions.
Reconstructed images demonstrate no additional findings.
IMPRESSION: 1. Support devices as above.
2. Trace bilateral pleural effusions, with dependent lower lobe
hypoventilatory changes.
3. Wall thickening of the cecum and ascending colon, nonspecific.
This could be due to inflammatory/infectious colitis, versus
ischemic changes in a patient with a history of prior respiratory
arrest.
4. Trace ascites.
5. Mild body wall edema.
6. Retained contrast within the bilateral renal parenchyma
consistent with underlying renal dysfunction.

## 2022-03-26 ENCOUNTER — Ambulatory Visit: Payer: Commercial Managed Care - PPO | Admitting: Family Medicine
